# Patient Record
Sex: Female | Born: 1955 | Race: White | Hispanic: No | Marital: Married | Smoking: Never smoker
Health system: Southern US, Community
[De-identification: ages and names within clinical notes are randomized; demographics above are authoritative.]

## PROBLEM LIST (undated history)

## (undated) DIAGNOSIS — E079 Disorder of thyroid, unspecified: Secondary | ICD-10-CM

## (undated) DIAGNOSIS — I10 Essential (primary) hypertension: Secondary | ICD-10-CM

## (undated) HISTORY — DX: Essential (primary) hypertension: I10

## (undated) HISTORY — DX: Disorder of thyroid, unspecified: E07.9

---

## 1998-01-22 HISTORY — PX: CHOLECYSTECTOMY: SHX55

## 2012-06-24 ENCOUNTER — Other Ambulatory Visit: Payer: Self-pay

## 2012-06-24 MED ORDER — ROSUVASTATIN CALCIUM 10 MG PO TABS: 10.0000 mg | ORAL_TABLET | Freq: Every day | ORAL | Status: DC

## 2012-06-24 MED ORDER — HYDROCHLOROTHIAZIDE 25 MG PO TABS: 25.0000 mg | ORAL_TABLET | Freq: Every day | ORAL | Status: DC

## 2012-06-24 NOTE — Telephone Encounter (Signed)
Last seen 09/25/11  Last lipids 12/16.13

## 2012-11-18 ENCOUNTER — Other Ambulatory Visit (INDEPENDENT_AMBULATORY_CARE_PROVIDER_SITE_OTHER): Payer: 59

## 2012-11-18 ENCOUNTER — Encounter (INDEPENDENT_AMBULATORY_CARE_PROVIDER_SITE_OTHER): Payer: Self-pay

## 2012-11-18 DIAGNOSIS — E785 Hyperlipidemia, unspecified: Secondary | ICD-10-CM

## 2012-11-18 DIAGNOSIS — I10 Essential (primary) hypertension: Secondary | ICD-10-CM

## 2012-11-18 DIAGNOSIS — R7309 Other abnormal glucose: Secondary | ICD-10-CM

## 2012-11-18 DIAGNOSIS — E039 Hypothyroidism, unspecified: Secondary | ICD-10-CM

## 2012-11-18 DIAGNOSIS — R739 Hyperglycemia, unspecified: Secondary | ICD-10-CM

## 2012-11-20 LAB — CMP14+EGFR
ALT: 18 IU/L (ref 0–32)
AST: 19 IU/L (ref 0–40)
Albumin/Globulin Ratio: 1.5 (ref 1.1–2.5)
Albumin: 4.1 g/dL (ref 3.5–5.5)
Alkaline Phosphatase: 80 IU/L (ref 39–117)
BUN/Creatinine Ratio: 15 (ref 9–23)
BUN: 14 mg/dL (ref 6–24)
CO2: 22 mmol/L (ref 18–29)
Calcium: 9.1 mg/dL (ref 8.7–10.2)
Chloride: 100 mmol/L (ref 97–108)
Creatinine, Ser: 0.95 mg/dL (ref 0.57–1.00)
GFR calc Af Amer: 77 mL/min/{1.73_m2} (ref >59)
GFR calc non Af Amer: 67 mL/min/{1.73_m2} (ref >59)
Globulin, Total: 2.7 g/dL (ref 1.5–4.5)
Glucose: 111 mg/dL — ABNORMAL HIGH (ref 65–99)
Potassium: 4.4 mmol/L (ref 3.5–5.2)
Sodium: 140 mmol/L (ref 134–144)
Total Bilirubin: 0.2 mg/dL (ref 0.0–1.2)
Total Protein: 6.8 g/dL (ref 6.0–8.5)

## 2012-11-20 LAB — NMR, LIPOPROFILE
Cholesterol: 206 mg/dL — ABNORMAL HIGH (ref <200)
HDL Cholesterol by NMR: 46 mg/dL (ref >=40)
HDL Particle Number: 31.3 umol/L (ref >=30.5)
LDL Particle Number: 2539 nmol/L — ABNORMAL HIGH (ref <1000)
LDL Size: 19.8 nm — ABNORMAL LOW (ref >20.5)
LDLC SERPL CALC-MCNC: 122 mg/dL — ABNORMAL HIGH (ref <100)
LP-IR Score: 61 — ABNORMAL HIGH (ref <=45)
Small LDL Particle Number: 1923 nmol/L — ABNORMAL HIGH (ref <=527)
Triglycerides by NMR: 189 mg/dL — ABNORMAL HIGH (ref <150)

## 2012-11-20 LAB — THYROID PANEL WITH TSH: T3 Uptake Ratio: 29 % (ref 24–39)

## 2012-11-24 ENCOUNTER — Encounter: Payer: Self-pay | Admitting: Nurse Practitioner

## 2012-11-24 ENCOUNTER — Ambulatory Visit (INDEPENDENT_AMBULATORY_CARE_PROVIDER_SITE_OTHER): Payer: 59 | Admitting: Nurse Practitioner

## 2012-11-24 VITALS — BP 146/89 | HR 92 | Temp 98.6°F | Ht 65.0 in | Wt 175.0 lb

## 2012-11-24 DIAGNOSIS — I10 Essential (primary) hypertension: Secondary | ICD-10-CM | POA: Insufficient documentation

## 2012-11-24 DIAGNOSIS — E039 Hypothyroidism, unspecified: Secondary | ICD-10-CM | POA: Insufficient documentation

## 2012-11-24 DIAGNOSIS — E785 Hyperlipidemia, unspecified: Secondary | ICD-10-CM | POA: Insufficient documentation

## 2012-11-24 MED ORDER — LEVOTHYROXINE SODIUM 175 MCG PO TABS: 175.0000 ug | ORAL_TABLET | Freq: Every day | ORAL | Status: DC

## 2012-11-24 MED ORDER — HYDROCHLOROTHIAZIDE 25 MG PO TABS: 25.0000 mg | ORAL_TABLET | Freq: Every day | ORAL | Status: DC

## 2012-11-24 MED ORDER — ROSUVASTATIN CALCIUM 10 MG PO TABS: 10.0000 mg | ORAL_TABLET | Freq: Every day | ORAL | Status: DC

## 2012-11-24 NOTE — Progress Notes (Signed)
Subjective:    Patient ID: Teresa Ayala, female    DOB: 1955/05/30, 57 y.o.   MRN: [id number]  Hypertension This is a chronic problem. The current episode started more than 1 year ago. The problem has been resolved since onset. The problem is uncontrolled. Pertinent negatives include no blurred vision, chest pain, headaches, neck pain, orthopnea, palpitations, peripheral edema, shortness of breath or sweats. There are no associated agents to hypertension. Risk factors for coronary artery disease include dyslipidemia, family history and post-menopausal state. Treatments tried: patient stoped hger HCTZ. The current treatment provides moderate improvement. Compliance problems include diet and exercise.  Hypertensive end-organ damage includes a thyroid problem.  Hyperlipidemia This is a chronic problem. The current episode started more than 1 year ago. The problem is uncontrolled. Recent lipid tests were reviewed and are high. Exacerbating diseases include hypothyroidism. She has no history of diabetes, obesity or nephrotic syndrome. Pertinent negatives include no chest pain or shortness of breath. Treatments tried: currently not on anything. The current treatment provides no improvement of lipids. Compliance problems include adherence to diet and adherence to exercise.  Risk factors for coronary artery disease include hypertension and post-menopausal.  Thyroid Problem Presents for follow-up (hypothyroidism) visit. Patient reports no anxiety, cold intolerance, depressed mood, diarrhea, dry skin, hoarse voice, menstrual problem, palpitations, visual change or weight loss. Her past medical history is significant for hyperlipidemia. There is no history of diabetes.      Review of Systems  Constitutional: Negative for weight loss.  HENT: Negative for hoarse voice.   Eyes: Negative for blurred vision.  Respiratory: Negative for shortness of breath.   Cardiovascular: Negative for chest pain, palpitations and  orthopnea.  Gastrointestinal: Negative for diarrhea.  Endocrine: Negative for cold intolerance.  Genitourinary: Negative for menstrual problem.  Musculoskeletal: Negative for neck pain.  Neurological: Negative for headaches.       Objective:   Physical Exam  Constitutional: She is oriented to person, place, and time. She appears well-developed and well-nourished.  HENT:  Nose: Nose normal.  Mouth/Throat: Oropharynx is clear and moist.  Eyes: EOM are normal.  Neck: Trachea normal, normal range of motion and full passive range of motion without pain. Neck supple. No JVD present. Carotid bruit is not present. No thyromegaly present.  Cardiovascular: Normal rate, regular rhythm, normal heart sounds and intact distal pulses.  Exam reveals no gallop and no friction rub.   No murmur heard. Pulmonary/Chest: Effort normal and breath sounds normal.  Abdominal: Soft. Bowel sounds are normal. She exhibits no distension and no mass. There is no tenderness.  Musculoskeletal: Normal range of motion.  Lymphadenopathy:    She has no cervical adenopathy.  Neurological: She is alert and oriented to person, place, and time. She has normal reflexes.  Skin: Skin is warm and dry.  Psychiatric: She has a normal mood and affect. Her behavior is normal. Judgment and thought content normal.    BP 146/89  Pulse 92  Temp(Src) 98.6 F (37 C) (Oral)  Ht 5\' 5"  (1.651 m)  Wt 175 lb (79.379 kg)  BMI 29.12 kg/m2       Assessment & Plan:   1. Hypertension   2. Hyperlipidemia LDL goal < 100   3. Hypothyroidism    No orders of the defined types were placed in this encounter.   Meds ordered this encounter  Medications  . levothyroxine (SYNTHROID, LEVOTHROID) 175 MCG tablet    Sig: Take 175 mcg by mouth daily before breakfast.  . aspirin EC  81 MG tablet    Sig: Take 81 mg by mouth daily.  . hydrochlorothiazide (HYDRODIURIL) 25 MG tablet    Sig: Take 1 tablet (25 mg total) by mouth daily.    Dispense:   90 tablet    Refill:  1    Order Specific Question:  Supervising Provider    Answer:  Ernestina Penna [[id number]]  . rosuvastatin (CRESTOR) 10 MG tablet    Sig: Take 1 tablet (10 mg total) by mouth daily.    Dispense:  90 tablet    Refill:  1    Order Specific Question:  Supervising Provider    Answer:  Deborra Medina    Continue all meds Labs reviewed at appointment Diet and exercise encouraged Health maintenance reviewed Follow up in 3 months  Mary-Margaret Daphine Deutscher, FNP

## 2012-11-24 NOTE — Patient Instructions (Signed)

## 2013-01-20 ENCOUNTER — Other Ambulatory Visit: Payer: Self-pay | Admitting: Nurse Practitioner

## 2013-02-19 ENCOUNTER — Encounter: Payer: Self-pay | Admitting: Nurse Practitioner

## 2013-02-26 ENCOUNTER — Encounter: Payer: Self-pay | Admitting: Nurse Practitioner

## 2013-02-28 ENCOUNTER — Other Ambulatory Visit: Payer: Self-pay | Admitting: Nurse Practitioner

## 2013-02-28 MED ORDER — ROSUVASTATIN CALCIUM 10 MG PO TABS: 10.0000 mg | ORAL_TABLET | Freq: Every day | ORAL | Status: DC

## 2013-03-07 ENCOUNTER — Encounter: Payer: Self-pay | Admitting: Nurse Practitioner

## 2013-03-20 ENCOUNTER — Other Ambulatory Visit: Payer: Self-pay | Admitting: Nurse Practitioner

## 2013-04-22 ENCOUNTER — Other Ambulatory Visit: Payer: Self-pay | Admitting: *Deleted

## 2013-04-22 MED ORDER — LEVOTHYROXINE SODIUM 75 MCG PO TABS: ORAL_TABLET | ORAL | Status: DC

## 2013-04-28 ENCOUNTER — Other Ambulatory Visit: Payer: Self-pay | Admitting: *Deleted

## 2013-04-28 MED ORDER — HYDROCHLOROTHIAZIDE 25 MG PO TABS: 25.0000 mg | ORAL_TABLET | Freq: Every day | ORAL | Status: DC

## 2013-04-28 NOTE — Telephone Encounter (Signed)
Patient NTBS for follow up and lab work  

## 2013-05-04 ENCOUNTER — Other Ambulatory Visit: Payer: Self-pay | Admitting: *Deleted

## 2013-05-04 MED ORDER — ROSUVASTATIN CALCIUM 10 MG PO TABS: 10.0000 mg | ORAL_TABLET | Freq: Every day | ORAL | Status: DC

## 2013-05-27 ENCOUNTER — Other Ambulatory Visit: Payer: Self-pay | Admitting: *Deleted

## 2013-05-27 MED ORDER — HYDROCHLOROTHIAZIDE 25 MG PO TABS: 25.0000 mg | ORAL_TABLET | Freq: Every day | ORAL | Status: DC

## 2013-05-27 NOTE — Telephone Encounter (Signed)
No ore refills after this without being seen

## 2013-05-27 NOTE — Telephone Encounter (Signed)
Patient notified at last refill that NTBS. Please advise on refill 

## 2013-06-04 ENCOUNTER — Other Ambulatory Visit: Payer: Self-pay

## 2013-06-04 NOTE — Telephone Encounter (Signed)
Last seen 11/24/12  MMM Last lipid 11/18/12

## 2013-06-04 NOTE — Telephone Encounter (Signed)
ntbs for crestor refill

## 2013-07-22 ENCOUNTER — Other Ambulatory Visit: Payer: Self-pay | Admitting: Nurse Practitioner

## 2013-09-19 ENCOUNTER — Other Ambulatory Visit: Payer: Self-pay | Admitting: Nurse Practitioner

## 2013-09-21 NOTE — Telephone Encounter (Signed)
Last ov 11/14. ntbs 

## 2013-09-22 NOTE — Telephone Encounter (Signed)
no more refills without being seen  

## 2013-10-01 ENCOUNTER — Other Ambulatory Visit: Payer: Self-pay | Admitting: Nurse Practitioner

## 2013-10-08 ENCOUNTER — Telehealth: Payer: Self-pay | Admitting: Nurse Practitioner

## 2013-10-08 ENCOUNTER — Encounter: Payer: Self-pay | Admitting: Nurse Practitioner

## 2013-10-08 MED ORDER — ROSUVASTATIN CALCIUM 10 MG PO TABS: 10.0000 mg | ORAL_TABLET | Freq: Every day | ORAL | Status: DC

## 2013-10-08 NOTE — Telephone Encounter (Signed)
Left message that crestor has been refilled and to call and schedule appt with Gennette Pac for the first available spot the switchboard is able to schedule.  I am unable to work her into a triage a slot for a refill.

## 2013-10-17 ENCOUNTER — Other Ambulatory Visit: Payer: Self-pay | Admitting: Nurse Practitioner

## 2013-12-14 ENCOUNTER — Ambulatory Visit (INDEPENDENT_AMBULATORY_CARE_PROVIDER_SITE_OTHER): Payer: 59 | Admitting: Nurse Practitioner

## 2013-12-14 ENCOUNTER — Encounter: Payer: Self-pay | Admitting: Nurse Practitioner

## 2013-12-14 VITALS — BP 132/82 | HR 85 | Temp 96.4°F | Ht 65.0 in | Wt 172.0 lb

## 2013-12-14 DIAGNOSIS — I10 Essential (primary) hypertension: Secondary | ICD-10-CM

## 2013-12-14 DIAGNOSIS — E785 Hyperlipidemia, unspecified: Secondary | ICD-10-CM

## 2013-12-14 DIAGNOSIS — Z0000 Encounter for general adult medical examination without abnormal findings: Secondary | ICD-10-CM

## 2013-12-14 DIAGNOSIS — E039 Hypothyroidism, unspecified: Secondary | ICD-10-CM

## 2013-12-14 LAB — POCT UA - MICROSCOPIC ONLY
BACTERIA, U MICROSCOPIC: NEGATIVE
CRYSTALS, UR, HPF, POC: NEGATIVE
Casts, Ur, LPF, POC: NEGATIVE
Mucus, UA: NEGATIVE
Yeast, UA: NEGATIVE

## 2013-12-14 LAB — POCT URINALYSIS DIPSTICK
BILIRUBIN UA: NEGATIVE
Glucose, UA: NEGATIVE
KETONES UA: NEGATIVE
Leukocytes, UA: NEGATIVE
Nitrite, UA: NEGATIVE
Protein, UA: NEGATIVE
SPEC GRAV UA: 1.015
Urobilinogen, UA: NEGATIVE
pH, UA: 6

## 2013-12-14 MED ORDER — ROSUVASTATIN CALCIUM 10 MG PO TABS: 10.0000 mg | ORAL_TABLET | Freq: Every day | ORAL | Status: DC

## 2013-12-14 MED ORDER — HYDROCHLOROTHIAZIDE 25 MG PO TABS: 25.0000 mg | ORAL_TABLET | Freq: Every day | ORAL | Status: DC

## 2013-12-14 MED ORDER — LEVOTHYROXINE SODIUM 75 MCG PO TABS: ORAL_TABLET | ORAL | Status: DC

## 2013-12-14 NOTE — Progress Notes (Signed)
Subjective:    Patient ID: Teresa Ayala, female    DOB: 04/26/1955, 58 y.o.   MRN: [id number] Patient is here for annual physical with no PAP. No acute complaint.   Hypertension This is a chronic problem. The current episode started more than 1 year ago. The problem is controlled. Pertinent negatives include no chest pain, headaches, neck pain, palpitations or shortness of breath. Risk factors for coronary artery disease include dyslipidemia. Past treatments include diuretics. The current treatment provides moderate improvement. Compliance problems include diet.  Hypertensive end-organ damage includes a thyroid problem.  Hyperlipidemia This is a chronic problem. The current episode started more than 1 year ago. The problem is controlled. Recent lipid tests were reviewed and are normal. Exacerbating diseases include hypothyroidism. She has no history of diabetes or obesity. Pertinent negatives include no chest pain or shortness of breath. Current antihyperlipidemic treatment includes statins. Risk factors for coronary artery disease include hypertension and dyslipidemia.  Thyroid Problem Presents for follow-up (hypothyroidism) visit. Patient reports no cold intolerance, diarrhea, menstrual problem, palpitations or visual change. The symptoms have been improving. Her past medical history is significant for hyperlipidemia. There is no history of diabetes.      Review of Systems  Respiratory: Negative for shortness of breath.   Cardiovascular: Negative for chest pain and palpitations.  Gastrointestinal: Negative for diarrhea.  Endocrine: Negative for cold intolerance.  Genitourinary: Negative for menstrual problem.  Musculoskeletal: Negative for neck pain.  Neurological: Negative for headaches.  All other systems reviewed and are negative.      Objective:   Physical Exam  Constitutional: She is oriented to person, place, and time. She appears well-developed and well-nourished.  HENT:  Nose:  Nose normal.  Mouth/Throat: Oropharynx is clear and moist.  Eyes: EOM are normal.  Neck: Trachea normal, normal range of motion and full passive range of motion without pain. Neck supple. No JVD present. Carotid bruit is not present. No thyromegaly present.  Cardiovascular: Normal rate, regular rhythm, normal heart sounds and intact distal pulses.  Exam reveals no gallop and no friction rub.   No murmur heard. Pulmonary/Chest: Effort normal and breath sounds normal.  Abdominal: Soft. Bowel sounds are normal. She exhibits no distension and no mass. There is no tenderness.  Musculoskeletal: Normal range of motion.  Lymphadenopathy:    She has no cervical adenopathy.  Neurological: She is alert and oriented to person, place, and time. She has normal reflexes.  Skin: Skin is warm and dry.  Psychiatric: She has a normal mood and affect. Her behavior is normal. Judgment and thought content normal.    BP 132/82 mmHg  Pulse 85  Temp(Src) 96.4 F (35.8 C) (Oral)  Ht 5\' 5"  (1.651 m)  Wt 172 lb (78.019 kg)  BMI 28.62 kg/m2       Assessment & Plan:   1. Annual physical exam - POCT UA - Microscopic Only - POCT urinalysis dipstick - CBC With differential/Platelet  2. Hypothyroidism, unspecified hypothyroidism type - Thyroid Panel With TSH - levothyroxine (SYNTHROID, LEVOTHROID) 75 MCG tablet; TAKE 1 TABLET BY MOUTH EVERY DAY BEFORE BREAKFAST  Dispense: 90 tablet; Refill: 1  3. Hyperlipidemia with target LDL less than 100 Low fat diet - NMR, lipoprofile - rosuvastatin (CRESTOR) 10 MG tablet; Take 1 tablet (10 mg total) by mouth daily.  Dispense: 90 tablet; Refill: 1  4. Essential hypertension Do not add salt to diet - CMP14+EGFR - hydrochlorothiazide (HYDRODIURIL) 25 MG tablet; Take 1 tablet (25 mg total) by mouth daily.  Dispense: 90 tablet; Refill: 1    Labs pending Health maintenance reviewed Diet and exercise encouraged Continue all meds Follow up  In 6 months    [place], FNP

## 2013-12-14 NOTE — Patient Instructions (Signed)

## 2013-12-15 LAB — CBC WITH DIFFERENTIAL
Basophils Absolute: 0 10*3/uL (ref 0.0–0.2)
Basos: 1 %
EOS: 1 %
Eosinophils Absolute: 0.1 10*3/uL (ref 0.0–0.4)
HCT: 38.5 % (ref 34.0–46.6)
HEMOGLOBIN: 13.2 g/dL (ref 11.1–15.9)
IMMATURE GRANS (ABS): 0 10*3/uL (ref 0.0–0.1)
IMMATURE GRANULOCYTES: 0 %
Lymphocytes Absolute: 2.2 10*3/uL (ref 0.7–3.1)
Lymphs: 40 %
MCH: 32.4 pg (ref 26.6–33.0)
MCHC: 34.3 g/dL (ref 31.5–35.7)
MCV: 94 fL (ref 79–97)
MONOCYTES: 7 %
Monocytes Absolute: 0.4 10*3/uL (ref 0.1–0.9)
NEUTROS PCT: 51 %
Neutrophils Absolute: 2.8 10*3/uL (ref 1.4–7.0)
Platelets: 253 10*3/uL (ref 150–379)
RBC: 4.08 x10E6/uL (ref 3.77–5.28)
RDW: 13.6 % (ref 12.3–15.4)
WBC: 5.4 10*3/uL (ref 3.4–10.8)

## 2013-12-15 LAB — CMP14+EGFR
A/G RATIO: 1.5 (ref 1.1–2.5)
ALK PHOS: 58 IU/L (ref 39–117)
ALT: 19 IU/L (ref 0–32)
AST: 21 IU/L (ref 0–40)
Albumin: 4.1 g/dL (ref 3.5–5.5)
BUN / CREAT RATIO: 13 (ref 9–23)
BUN: 12 mg/dL (ref 6–24)
CO2: 26 mmol/L (ref 18–29)
CREATININE: 0.92 mg/dL (ref 0.57–1.00)
Calcium: 9.2 mg/dL (ref 8.7–10.2)
Chloride: 101 mmol/L (ref 97–108)
GFR calc Af Amer: 80 mL/min/{1.73_m2} (ref >59)
GFR, EST NON AFRICAN AMERICAN: 69 mL/min/{1.73_m2} (ref >59)
GLOBULIN, TOTAL: 2.8 g/dL (ref 1.5–4.5)
Glucose: 95 mg/dL (ref 65–99)
Potassium: 4 mmol/L (ref 3.5–5.2)
SODIUM: 142 mmol/L (ref 134–144)
Total Bilirubin: 0.4 mg/dL (ref 0.0–1.2)
Total Protein: 6.9 g/dL (ref 6.0–8.5)

## 2013-12-15 LAB — THYROID PANEL WITH TSH
Free Thyroxine Index: 2.8 (ref 1.2–4.9)
T3 UPTAKE RATIO: 29 % (ref 24–39)
T4, Total: 9.5 ug/dL (ref 4.5–12.0)
TSH: 3.01 u[IU]/mL (ref 0.450–4.500)

## 2013-12-15 LAB — NMR, LIPOPROFILE
CHOLESTEROL: 111 mg/dL (ref 100–199)
HDL CHOLESTEROL BY NMR: 45 mg/dL (ref >39)
HDL Particle Number: 35.2 umol/L (ref >=30.5)
LDL Particle Number: 897 nmol/L (ref <1000)
LDL Size: 19.9 nm (ref >20.5)
LDL-C: 49 mg/dL (ref 0–99)
LP-IR Score: 64 — ABNORMAL HIGH (ref <=45)
Small LDL Particle Number: 626 nmol/L — ABNORMAL HIGH (ref <=527)
Triglycerides by NMR: 86 mg/dL (ref 0–149)

## 2013-12-29 ENCOUNTER — Encounter: Payer: Self-pay | Admitting: Nurse Practitioner

## 2014-01-03 ENCOUNTER — Other Ambulatory Visit: Payer: Self-pay | Admitting: Nurse Practitioner

## 2014-01-25 ENCOUNTER — Other Ambulatory Visit: Payer: Self-pay | Admitting: Nurse Practitioner

## 2014-06-08 ENCOUNTER — Other Ambulatory Visit: Payer: Self-pay | Admitting: Nurse Practitioner

## 2014-06-14 ENCOUNTER — Ambulatory Visit (INDEPENDENT_AMBULATORY_CARE_PROVIDER_SITE_OTHER): Payer: 59 | Admitting: Nurse Practitioner

## 2014-06-14 ENCOUNTER — Encounter: Payer: Self-pay | Admitting: Nurse Practitioner

## 2014-06-14 DIAGNOSIS — E039 Hypothyroidism, unspecified: Secondary | ICD-10-CM

## 2014-06-14 DIAGNOSIS — I10 Essential (primary) hypertension: Secondary | ICD-10-CM

## 2014-06-14 DIAGNOSIS — E785 Hyperlipidemia, unspecified: Secondary | ICD-10-CM

## 2014-06-14 MED ORDER — HYDROCHLOROTHIAZIDE 25 MG PO TABS: ORAL_TABLET | ORAL | Status: DC

## 2014-06-14 MED ORDER — ROSUVASTATIN CALCIUM 10 MG PO TABS
10.0000 mg | ORAL_TABLET | Freq: Every day | ORAL | Status: DC
Start: 2014-06-14 — End: 2014-09-30

## 2014-06-14 MED ORDER — LEVOTHYROXINE SODIUM 75 MCG PO TABS: ORAL_TABLET | ORAL | Status: DC

## 2014-06-14 NOTE — Patient Instructions (Signed)
Fat and Cholesterol Control Diet Fat and cholesterol levels in your blood and organs are influenced by your diet. High levels of fat and cholesterol may lead to diseases of the heart, small and large blood vessels, gallbladder, liver, and pancreas. CONTROLLING FAT AND CHOLESTEROL WITH DIET Although exercise and lifestyle factors are important, your diet is key. That is because certain foods are known to raise cholesterol and others to lower it. The goal is to balance foods for their effect on cholesterol and more importantly, to replace saturated and trans fat with other types of fat, such as monounsaturated fat, polyunsaturated fat, and omega-3 fatty acids. On average, a person should consume no more than 15 to 17 g of saturated fat daily. Saturated and trans fats are considered "bad" fats, and they will raise LDL cholesterol. Saturated fats are primarily found in animal products such as meats, butter, and cream. However, that does not mean you need to give up all your favorite foods. Today, there are good tasting, low-fat, low-cholesterol substitutes for most of the things you like to eat. Choose low-fat or nonfat alternatives. Choose round or loin cuts of red meat. These types of cuts are lowest in fat and cholesterol. Chicken (without the skin), fish, veal, and ground turkey breast are great choices. Eliminate fatty meats, such as hot dogs and salami. Even shellfish have little or no saturated fat. Have a 3 oz (85 g) portion when you eat lean meat, poultry, or fish. Trans fats are also called "partially hydrogenated oils." They are oils that have been scientifically manipulated so that they are solid at room temperature resulting in a longer shelf life and improved taste and texture of foods in which they are added. Trans fats are found in stick margarine, some tub margarines, cookies, crackers, and baked goods.  When baking and cooking, oils are a great substitute for butter. The monounsaturated oils are  especially beneficial since it is believed they lower LDL and raise HDL. The oils you should avoid entirely are saturated tropical oils, such as coconut and palm.  Remember to eat a lot from food groups that are naturally free of saturated and trans fat, including fish, fruit, vegetables, beans, grains (barley, rice, couscous, bulgur wheat), and pasta (without cream sauces).  IDENTIFYING FOODS THAT LOWER FAT AND CHOLESTEROL  Soluble fiber may lower your cholesterol. This type of fiber is found in fruits such as apples, vegetables such as broccoli, potatoes, and carrots, legumes such as beans, peas, and lentils, and grains such as barley. Foods fortified with plant sterols (phytosterol) may also lower cholesterol. You should eat at least 2 g per day of these foods for a cholesterol lowering effect.  Read package labels to identify low-saturated fats, trans fat free, and low-fat foods at the supermarket. Select cheeses that have only 2 to 3 g saturated fat per ounce. Use a heart-healthy tub margarine that is free of trans fats or partially hydrogenated oil. When buying baked goods (cookies, crackers), avoid partially hydrogenated oils. Breads and muffins should be made from whole grains (whole-wheat or whole oat flour, instead of "flour" or "enriched flour"). Buy non-creamy canned soups with reduced salt and no added fats.  FOOD PREPARATION TECHNIQUES  Never deep-fry. If you must fry, either stir-fry, which uses very little fat, or use non-stick cooking sprays. When possible, broil, bake, or roast meats, and steam vegetables. Instead of putting butter or margarine on vegetables, use lemon and herbs, applesauce, and cinnamon (for squash and sweet potatoes). Use nonfat   yogurt, salsa, and low-fat dressings for salads.  LOW-SATURATED FAT / LOW-FAT FOOD SUBSTITUTES Meats / Saturated Fat (g)  Avoid: Steak, marbled (3 oz/85 g) / 11 g  Choose: Steak, lean (3 oz/85 g) / 4 g  Avoid: Hamburger (3 oz/85 g) / 7  g  Choose: Hamburger, lean (3 oz/85 g) / 5 g  Avoid: Ham (3 oz/85 g) / 6 g  Choose: Ham, lean cut (3 oz/85 g) / 2.4 g  Avoid: Chicken, with skin, dark meat (3 oz/85 g) / 4 g  Choose: Chicken, skin removed, dark meat (3 oz/85 g) / 2 g  Avoid: Chicken, with skin, light meat (3 oz/85 g) / 2.5 g  Choose: Chicken, skin removed, light meat (3 oz/85 g) / 1 g Dairy / Saturated Fat (g)  Avoid: Whole milk (1 cup) / 5 g  Choose: Low-fat milk, 2% (1 cup) / 3 g  Choose: Low-fat milk, 1% (1 cup) / 1.5 g  Choose: Skim milk (1 cup) / 0.3 g  Avoid: Hard cheese (1 oz/28 g) / 6 g  Choose: Skim milk cheese (1 oz/28 g) / 2 to 3 g  Avoid: Cottage cheese, 4% fat (1 cup) / 6.5 g  Choose: Low-fat cottage cheese, 1% fat (1 cup) / 1.5 g  Avoid: Ice cream (1 cup) / 9 g  Choose: Sherbet (1 cup) / 2.5 g  Choose: Nonfat frozen yogurt (1 cup) / 0.3 g  Choose: Frozen fruit bar / trace  Avoid: Whipped cream (1 tbs) / 3.5 g  Choose: Nondairy whipped topping (1 tbs) / 1 g Condiments / Saturated Fat (g)  Avoid: Mayonnaise (1 tbs) / 2 g  Choose: Low-fat mayonnaise (1 tbs) / 1 g  Avoid: Butter (1 tbs) / 7 g  Choose: Extra light margarine (1 tbs) / 1 g  Avoid: Coconut oil (1 tbs) / 11.8 g  Choose: Olive oil (1 tbs) / 1.8 g  Choose: Corn oil (1 tbs) / 1.7 g  Choose: Safflower oil (1 tbs) / 1.2 g  Choose: Sunflower oil (1 tbs) / 1.4 g  Choose: Soybean oil (1 tbs) / 2.4 g  Choose: Canola oil (1 tbs) / 1 g Document Released: 01/08/2005 Document Revised: 05/05/2012 Document Reviewed: 04/08/2013 [employer] Patient Information 2015 [employer]. This information is not intended to replace advice given to you by your health care provider. Make sure you discuss any questions you have with your health care provider.  

## 2014-06-14 NOTE — Progress Notes (Signed)
Subjective:    Patient ID: Teresa Ayala, female    DOB: Jan 14, 1956, 58 y.o.   MRN: [id number]  Patient here today for follow up of chronic medical problems. No complaints today.   Hypertension This is a chronic problem. The current episode started more than 1 year ago. The problem is controlled. Pertinent negatives include no chest pain, headaches, neck pain, palpitations or shortness of breath. Risk factors for coronary artery disease include dyslipidemia. Past treatments include diuretics. The current treatment provides moderate improvement. Compliance problems include diet.  Hypertensive end-organ damage includes a thyroid problem.  Hyperlipidemia This is a chronic problem. The current episode started more than 1 year ago. The problem is controlled. Recent lipid tests were reviewed and are normal. Exacerbating diseases include hypothyroidism. She has no history of diabetes or obesity. Pertinent negatives include no chest pain or shortness of breath. Current antihyperlipidemic treatment includes statins. Risk factors for coronary artery disease include hypertension and dyslipidemia.  Thyroid Problem Presents for follow-up (hypothyroidism) visit. Patient reports no cold intolerance, diarrhea, menstrual problem, palpitations or visual change. The symptoms have been improving. Her past medical history is significant for hyperlipidemia. There is no history of diabetes.      Review of Systems  Respiratory: Negative for shortness of breath.   Cardiovascular: Negative for chest pain and palpitations.  Gastrointestinal: Negative for diarrhea.  Endocrine: Negative for cold intolerance.  Genitourinary: Negative for menstrual problem.  Musculoskeletal: Negative for neck pain.  Neurological: Negative for headaches.  All other systems reviewed and are negative.      Objective:   Physical Exam  Constitutional: She is oriented to person, place, and time. She appears well-developed and well-nourished.   HENT:  Nose: Nose normal.  Mouth/Throat: Oropharynx is clear and moist.  Eyes: EOM are normal.  Neck: Trachea normal, normal range of motion and full passive range of motion without pain. Neck supple. No JVD present. Carotid bruit is not present. No thyromegaly present.  Cardiovascular: Normal rate, regular rhythm, normal heart sounds and intact distal pulses.  Exam reveals no gallop and no friction rub.   No murmur heard. Pulmonary/Chest: Effort normal and breath sounds normal.  Abdominal: Soft. Bowel sounds are normal. She exhibits no distension and no mass. There is no tenderness.  Musculoskeletal: Normal range of motion.  Lymphadenopathy:    She has no cervical adenopathy.  Neurological: She is alert and oriented to person, place, and time. She has normal reflexes.  Skin: Skin is warm and dry.  Psychiatric: She has a normal mood and affect. Her behavior is normal. Judgment and thought content normal.    BP 132/82 mmHg  Pulse 85  Temp(Src) 98.7 F (37.1 C) (Oral)  Ht 5' 5"  (1.651 m)  Wt 173 lb 12.8 oz (78.835 kg)  BMI 28.92 kg/m2        Assessment & Plan:    1. Essential hypertension Do not add slat to diet - hydrochlorothiazide (HYDRODIURIL) 25 MG tablet; TAKE 1 TABLET (25 MG TOTAL) BY MOUTH DAILY.  Dispense: 90 tablet; Refill: 0 - CMP14+EGFR  2. Hypothyroidism, unspecified hypothyroidism type - levothyroxine (SYNTHROID, LEVOTHROID) 75 MCG tablet; TAKE 1 TABLET BY MOUTH EVERY DAY BEFORE BREAKFAST  Dispense: 90 tablet; Refill: 1  3. Hyperlipidemia with target LDL less than 100 Low fta diet - rosuvastatin (CRESTOR) 10 MG tablet; Take 1 tablet (10 mg total) by mouth daily.  Dispense: 90 tablet; Refill: 1 - NMR, lipoprofile    Labs pending Health maintenance reviewed Diet and exercise encouraged  Continue all meds Follow up  In 3 month   [place], FNP

## 2014-06-15 LAB — CMP14+EGFR
A/G RATIO: 1.5 (ref 1.1–2.5)
ALK PHOS: 64 IU/L (ref 39–117)
ALT: 18 IU/L (ref 0–32)
AST: 24 IU/L (ref 0–40)
Albumin: 4.2 g/dL (ref 3.5–5.5)
BILIRUBIN TOTAL: 0.3 mg/dL (ref 0.0–1.2)
BUN/Creatinine Ratio: 15 (ref 9–23)
BUN: 14 mg/dL (ref 6–24)
CHLORIDE: 99 mmol/L (ref 97–108)
CO2: 24 mmol/L (ref 18–29)
CREATININE: 0.96 mg/dL (ref 0.57–1.00)
Calcium: 9 mg/dL (ref 8.7–10.2)
GFR calc Af Amer: 76 mL/min/{1.73_m2} (ref >59)
GFR, EST NON AFRICAN AMERICAN: 66 mL/min/{1.73_m2} (ref >59)
Globulin, Total: 2.8 g/dL (ref 1.5–4.5)
Glucose: 110 mg/dL — ABNORMAL HIGH (ref 65–99)
Potassium: 3.8 mmol/L (ref 3.5–5.2)
SODIUM: 140 mmol/L (ref 134–144)
TOTAL PROTEIN: 7 g/dL (ref 6.0–8.5)

## 2014-06-15 LAB — NMR, LIPOPROFILE
Cholesterol: 128 mg/dL (ref 100–199)
HDL Cholesterol by NMR: 47 mg/dL (ref >39)
HDL PARTICLE NUMBER: 38.1 umol/L (ref >=30.5)
LDL Particle Number: 834 nmol/L (ref <1000)
LDL SIZE: 19.9 nm (ref >20.5)
LDL-C: 58 mg/dL (ref 0–99)
LP-IR SCORE: 55 — AB (ref <=45)
Small LDL Particle Number: 550 nmol/L — ABNORMAL HIGH (ref <=527)
Triglycerides by NMR: 115 mg/dL (ref 0–149)

## 2014-07-02 ENCOUNTER — Other Ambulatory Visit: Payer: Self-pay | Admitting: Nurse Practitioner

## 2014-07-23 ENCOUNTER — Other Ambulatory Visit: Payer: Self-pay | Admitting: Nurse Practitioner

## 2014-09-30 ENCOUNTER — Ambulatory Visit (INDEPENDENT_AMBULATORY_CARE_PROVIDER_SITE_OTHER): Payer: 59 | Admitting: Nurse Practitioner

## 2014-09-30 ENCOUNTER — Encounter: Payer: Self-pay | Admitting: Nurse Practitioner

## 2014-09-30 VITALS — BP 137/79 | HR 87 | Temp 97.8°F | Ht 65.0 in | Wt 173.0 lb

## 2014-09-30 DIAGNOSIS — E785 Hyperlipidemia, unspecified: Secondary | ICD-10-CM

## 2014-09-30 DIAGNOSIS — Z6829 Body mass index (BMI) 29.0-29.9, adult: Secondary | ICD-10-CM | POA: Insufficient documentation

## 2014-09-30 DIAGNOSIS — Z6828 Body mass index (BMI) 28.0-28.9, adult: Secondary | ICD-10-CM

## 2014-09-30 DIAGNOSIS — I10 Essential (primary) hypertension: Secondary | ICD-10-CM

## 2014-09-30 DIAGNOSIS — E039 Hypothyroidism, unspecified: Secondary | ICD-10-CM

## 2014-09-30 MED ORDER — HYDROCHLOROTHIAZIDE 25 MG PO TABS: ORAL_TABLET | ORAL | Status: DC

## 2014-09-30 MED ORDER — ROSUVASTATIN CALCIUM 10 MG PO TABS: 10.0000 mg | ORAL_TABLET | Freq: Every day | ORAL | Status: DC

## 2014-09-30 MED ORDER — LEVOTHYROXINE SODIUM 75 MCG PO TABS: ORAL_TABLET | ORAL | Status: DC

## 2014-09-30 NOTE — Progress Notes (Signed)
Subjective:    Patient ID: Teresa Ayala, female    DOB: 02-Sep-1955, 59 y.o.   MRN: [id number]  Patient here today for follow up of chronic medical problems. No complaints today.   Hypertension This is a chronic problem. The current episode started more than 1 year ago. The problem is controlled. Pertinent negatives include no chest pain, headaches, neck pain, palpitations or shortness of breath. Risk factors for coronary artery disease include dyslipidemia. Past treatments include diuretics. The current treatment provides moderate improvement. Compliance problems include diet.  Hypertensive end-organ damage includes a thyroid problem.  Hyperlipidemia This is a chronic problem. The current episode started more than 1 year ago. The problem is controlled. Recent lipid tests were reviewed and are normal. Exacerbating diseases include hypothyroidism. She has no history of diabetes or obesity. Pertinent negatives include no chest pain or shortness of breath. Current antihyperlipidemic treatment includes statins. Risk factors for coronary artery disease include hypertension and dyslipidemia.  Thyroid Problem Presents for follow-up (hypothyroidism) visit. Patient reports no cold intolerance, diarrhea, menstrual problem, palpitations or visual change. The symptoms have been improving. Her past medical history is significant for hyperlipidemia. There is no history of diabetes.      Review of Systems  Respiratory: Negative for shortness of breath.   Cardiovascular: Negative for chest pain and palpitations.  Gastrointestinal: Negative for diarrhea.  Endocrine: Negative for cold intolerance.  Genitourinary: Negative for menstrual problem.  Musculoskeletal: Negative for neck pain.  Neurological: Negative for headaches.  All other systems reviewed and are negative.      Objective:   Physical Exam  Constitutional: She is oriented to person, place, and time. She appears well-developed and well-nourished.   HENT:  Nose: Nose normal.  Mouth/Throat: Oropharynx is clear and moist.  Eyes: EOM are normal.  Neck: Trachea normal, normal range of motion and full passive range of motion without pain. Neck supple. No JVD present. Carotid bruit is not present. No thyromegaly present.  Cardiovascular: Normal rate, regular rhythm, normal heart sounds and intact distal pulses.  Exam reveals no gallop and no friction rub.   No murmur heard. Pulmonary/Chest: Effort normal and breath sounds normal.  Abdominal: Soft. Bowel sounds are normal. She exhibits no distension and no mass. There is no tenderness.  Musculoskeletal: Normal range of motion.  Lymphadenopathy:    She has no cervical adenopathy.  Neurological: She is alert and oriented to person, place, and time. She has normal reflexes.  Skin: Skin is warm and dry.  Psychiatric: She has a normal mood and affect. Her behavior is normal. Judgment and thought content normal.    BP 137/79 mmHg  Pulse 87  Temp(Src) 97.8 F (36.6 C) (Oral)  Ht 5' 5"  (1.651 m)  Wt 173 lb (78.472 kg)  BMI 28.79 kg/m2        Assessment & Plan:   1. Essential hypertension Do not add salt to diet - CMP14+EGFR - hydrochlorothiazide (HYDRODIURIL) 25 MG tablet; TAKE 1 TABLET (25 MG TOTAL) BY MOUTH DAILY.  Dispense: 90 tablet; Refill: 1  2. Hypothyroidism, unspecified hypothyroidism type - Thyroid Panel With TSH - levothyroxine (SYNTHROID, LEVOTHROID) 75 MCG tablet; TAKE 1 TABLET BY MOUTH EVERY DAY BEFORE BREAKFAST  Dispense: 90 tablet; Refill: 1  3. Hyperlipidemia with target LDL less than 100 Low fta diet - Lipid panel - rosuvastatin (CRESTOR) 10 MG tablet; Take 1 tablet (10 mg total) by mouth daily.  Dispense: 90 tablet; Refill: 1  4. BMI 28.0-28.9,adult Discussed diet and exercise for person  with BMI >25 Will recheck weight in 3-6 months     Labs pending Health maintenance reviewed Diet and exercise encouraged Continue all meds Follow up  In 6 month    [place], FNP

## 2014-09-30 NOTE — Patient Instructions (Signed)
Exercise to Stay Healthy Exercise helps you become and stay healthy. EXERCISE IDEAS AND TIPS Choose exercises that:  You enjoy.  Fit into your day. You do not need to exercise really hard to be healthy. You can do exercises at a slow or medium level and stay healthy. You can:  Stretch before and after working out.  Try yoga, Pilates, or tai chi.  Lift weights.  Walk fast, swim, jog, run, climb stairs, bicycle, dance, or rollerskate.  Take aerobic classes. Exercises that burn about 150 calories:  Running 1  miles in 15 minutes.  Playing volleyball for 45 to 60 minutes.  Washing and waxing a car for 45 to 60 minutes.  Playing touch football for 45 minutes.  Walking 1  miles in 35 minutes.  Pushing a stroller 1  miles in 30 minutes.  Playing basketball for 30 minutes.  Raking leaves for 30 minutes.  Bicycling 5 miles in 30 minutes.  Walking 2 miles in 30 minutes.  Dancing for 30 minutes.  Shoveling snow for 15 minutes.  Swimming laps for 20 minutes.  Walking up stairs for 15 minutes.  Bicycling 4 miles in 15 minutes.  Gardening for 30 to 45 minutes.  Jumping rope for 15 minutes.  Washing windows or floors for 45 to 60 minutes. Document Released: 02/10/2010 Document Revised: 04/02/2011 Document Reviewed: 02/10/2010 [facility] Patient Information 2015 [place], [place]. This information is not intended to replace advice given to you by your health care provider. Make sure you discuss any questions you have with your health care provider.

## 2014-10-01 LAB — CMP14+EGFR
A/G RATIO: 1.6 (ref 1.1–2.5)
ALK PHOS: 62 IU/L (ref 39–117)
ALT: 23 IU/L (ref 0–32)
AST: 26 IU/L (ref 0–40)
Albumin: 4.2 g/dL (ref 3.5–5.5)
BILIRUBIN TOTAL: 0.4 mg/dL (ref 0.0–1.2)
BUN/Creatinine Ratio: 16 (ref 9–23)
BUN: 16 mg/dL (ref 6–24)
CO2: 26 mmol/L (ref 18–29)
CREATININE: 0.97 mg/dL (ref 0.57–1.00)
Calcium: 9.4 mg/dL (ref 8.7–10.2)
Chloride: 98 mmol/L (ref 97–108)
GFR calc Af Amer: 74 mL/min/{1.73_m2} (ref >59)
GFR calc non Af Amer: 65 mL/min/{1.73_m2} (ref >59)
GLOBULIN, TOTAL: 2.7 g/dL (ref 1.5–4.5)
Glucose: 110 mg/dL — ABNORMAL HIGH (ref 65–99)
POTASSIUM: 4.2 mmol/L (ref 3.5–5.2)
SODIUM: 140 mmol/L (ref 134–144)
Total Protein: 6.9 g/dL (ref 6.0–8.5)

## 2014-10-01 LAB — LIPID PANEL
CHOLESTEROL TOTAL: 122 mg/dL (ref 100–199)
Chol/HDL Ratio: 2.8 ratio units (ref 0.0–4.4)
HDL: 44 mg/dL (ref >39)
LDL CALC: 49 mg/dL (ref 0–99)
Triglycerides: 147 mg/dL (ref 0–149)
VLDL CHOLESTEROL CAL: 29 mg/dL (ref 5–40)

## 2014-10-01 LAB — THYROID PANEL WITH TSH
Free Thyroxine Index: 2.7 (ref 1.2–4.9)
T3 Uptake Ratio: 29 % (ref 24–39)
T4 TOTAL: 9.2 ug/dL (ref 4.5–12.0)
TSH: 3.54 u[IU]/mL (ref 0.450–4.500)

## 2015-03-28 ENCOUNTER — Other Ambulatory Visit: Payer: Self-pay | Admitting: Nurse Practitioner

## 2015-03-31 ENCOUNTER — Ambulatory Visit (INDEPENDENT_AMBULATORY_CARE_PROVIDER_SITE_OTHER): Payer: 59 | Admitting: Nurse Practitioner

## 2015-03-31 ENCOUNTER — Encounter: Payer: Self-pay | Admitting: Internal Medicine

## 2015-03-31 ENCOUNTER — Encounter: Payer: Self-pay | Admitting: Nurse Practitioner

## 2015-03-31 ENCOUNTER — Ambulatory Visit (INDEPENDENT_AMBULATORY_CARE_PROVIDER_SITE_OTHER): Payer: 59

## 2015-03-31 VITALS — BP 144/82 | HR 95 | Temp 97.3°F | Ht 65.0 in | Wt 165.0 lb

## 2015-03-31 DIAGNOSIS — E785 Hyperlipidemia, unspecified: Secondary | ICD-10-CM | POA: Diagnosis not present

## 2015-03-31 DIAGNOSIS — I10 Essential (primary) hypertension: Secondary | ICD-10-CM

## 2015-03-31 DIAGNOSIS — Z1159 Encounter for screening for other viral diseases: Secondary | ICD-10-CM | POA: Diagnosis not present

## 2015-03-31 DIAGNOSIS — E039 Hypothyroidism, unspecified: Secondary | ICD-10-CM

## 2015-03-31 DIAGNOSIS — Z1212 Encounter for screening for malignant neoplasm of rectum: Secondary | ICD-10-CM | POA: Diagnosis not present

## 2015-03-31 DIAGNOSIS — Z6828 Body mass index (BMI) 28.0-28.9, adult: Secondary | ICD-10-CM | POA: Diagnosis not present

## 2015-03-31 MED ORDER — LEVOTHYROXINE SODIUM 75 MCG PO TABS: ORAL_TABLET | ORAL | Status: DC

## 2015-03-31 MED ORDER — ROSUVASTATIN CALCIUM 10 MG PO TABS: 10.0000 mg | ORAL_TABLET | Freq: Every day | ORAL | Status: DC

## 2015-03-31 MED ORDER — HYDROCHLOROTHIAZIDE 25 MG PO TABS: ORAL_TABLET | ORAL | Status: DC

## 2015-03-31 NOTE — Progress Notes (Signed)
Subjective:    Patient ID: Teresa Ayala, female    DOB: [birth year], 60 y.o.   MRN: [id number]  Patient here today for follow up of chronic medical problems.  Outpatient Encounter Prescriptions as of 03/31/2015  Medication Sig  . aspirin EC 81 MG tablet Take 81 mg by mouth daily.  . hydrochlorothiazide (HYDRODIURIL) 25 MG tablet TAKE 1 TABLET (25 MG TOTAL) BY MOUTH DAILY.  Marland Kitchen levothyroxine (SYNTHROID, LEVOTHROID) 75 MCG tablet TAKE 1 TABLET BY MOUTH EVERY DAY BEFORE BREAKFAST  . rosuvastatin (CRESTOR) 10 MG tablet Take 1 tablet (10 mg total) by mouth daily.   No facility-administered encounter medications on file as of 03/31/2015.     Hypertension This is a chronic problem. The current episode started more than 1 year ago. The problem is controlled. Pertinent negatives include no chest pain, headaches, neck pain, palpitations or shortness of breath. Risk factors for coronary artery disease include dyslipidemia. Past treatments include diuretics. The current treatment provides moderate improvement. Compliance problems include diet.  Hypertensive end-organ damage includes a thyroid problem.  Hyperlipidemia This is a chronic problem. The current episode started more than 1 year ago. The problem is controlled. Recent lipid tests were reviewed and are normal. Exacerbating diseases include hypothyroidism. She has no history of diabetes or obesity. Pertinent negatives include no chest pain or shortness of breath. Current antihyperlipidemic treatment includes statins. Risk factors for coronary artery disease include hypertension and dyslipidemia.  Thyroid Problem Presents for follow-up (hypothyroidism) visit. Patient reports no cold intolerance, diarrhea, menstrual problem, palpitations or visual change. The symptoms have been improving. Her past medical history is significant for hyperlipidemia. There is no history of diabetes.      Review of Systems  Respiratory: Negative for shortness of breath.    Cardiovascular: Negative for chest pain and palpitations.  Gastrointestinal: Negative for diarrhea.  Endocrine: Negative for cold intolerance.  Genitourinary: Negative for menstrual problem.  Musculoskeletal: Negative for neck pain.  Neurological: Negative for headaches.  All other systems reviewed and are negative.      Objective:   Physical Exam  Constitutional: She is oriented to person, place, and time. She appears well-developed and well-nourished.  HENT:  Nose: Nose normal.  Mouth/Throat: Oropharynx is clear and moist.  Eyes: EOM are normal.  Neck: Trachea normal, normal range of motion and full passive range of motion without pain. Neck supple. No JVD present. Carotid bruit is not present. No thyromegaly present.  Cardiovascular: Normal rate, regular rhythm, normal heart sounds and intact distal pulses.  Exam reveals no gallop and no friction rub.   No murmur heard. Pulmonary/Chest: Effort normal and breath sounds normal.  Abdominal: Soft. Bowel sounds are normal. She exhibits no distension and no mass. There is no tenderness.  Musculoskeletal: Normal range of motion.  Lymphadenopathy:    She has no cervical adenopathy.  Neurological: She is alert and oriented to person, place, and time. She has normal reflexes.  Skin: Skin is warm and dry.  Psychiatric: She has a normal mood and affect. Her behavior is normal. Judgment and thought content normal.   BP 144/82 mmHg  Pulse 95  Temp(Src) 97.3 F (36.3 C) (Oral)  Ht _0  (1.651 m)  Wt 165 lb (74.844 kg)  BMI 27.46 kg/m2  Chest xray- no cardiopulmonary abnormalities-Preliminary reading by Ronnald Collum, FNP  [facility]   EKG- NSR- Mary-Margaret Hassell Done, FNP      Assessment & Plan:  1. Essential hypertension Do not add salt to diet - CMP14+EGFR - hydrochlorothiazide (  HYDRODIURIL) 25 MG tablet; TAKE 1 TABLET (25 MG TOTAL) BY MOUTH DAILY.  Dispense: 90 tablet; Refill: 1 - DG Chest 2 View; Future - EKG 12-Lead  2.  Hypothyroidism, unspecified hypothyroidism type - levothyroxine (SYNTHROID, LEVOTHROID) 75 MCG tablet; TAKE 1 TABLET BY MOUTH EVERY DAY BEFORE BREAKFAST  Dispense: 90 tablet; Refill: 1  3. Hyperlipidemia with target LDL less than 100 Low fat diet - Lipid panel - rosuvastatin (CRESTOR) 10 MG tablet; Take 1 tablet (10 mg total) by mouth daily.  Dispense: 90 tablet; Refill: 1  4. BMI 28.0-28.9,adult Discussed diet and exercise for person with BMI >25 Will recheck weight in 3-6 months   5. Need for hepatitis C screening test - Hepatitis C antibody  6. Screening for malignant neoplasm of the rectum - Fecal occult blood, imunochemical; Future - Ambulatory referral to Gastroenterology    Labs pending Health maintenance reviewed Diet and exercise encouraged Continue all meds Follow up  In 6 months   [place], FNP

## 2015-03-31 NOTE — Patient Instructions (Signed)
Health Maintenance, Female Adopting a healthy lifestyle and getting preventive care can go a long way to promote health and wellness. Talk with your health care provider about what schedule of regular examinations is right for you. This is a good chance for you to check in with your provider about disease prevention and staying healthy. In between checkups, there are plenty of things you can do on your own. Experts have done a lot of research about which lifestyle changes and preventive measures are most likely to keep you healthy. Ask your health care provider for more information. WEIGHT AND DIET  Eat a healthy diet  Be sure to include plenty of vegetables, fruits, low-fat dairy products, and lean protein.  Do not eat a lot of foods high in solid fats, added sugars, or salt.  Get regular exercise. This is one of the most important things you can do for your health.  Most adults should exercise for at least 150 minutes each week. The exercise should increase your heart rate and make you sweat (moderate-intensity exercise).  Most adults should also do strengthening exercises at least twice a week. This is in addition to the moderate-intensity exercise.  Maintain a healthy weight  Body mass index (BMI) is a measurement that can be used to identify possible weight problems. It estimates body fat based on height and weight. Your health care provider can help determine your BMI and help you achieve or maintain a healthy weight.  For females 20 years of age and older:   A BMI below 18.5 is considered underweight.  A BMI of 18.5 to 24.9 is normal.  A BMI of 25 to 29.9 is considered overweight.  A BMI of 30 and above is considered obese.  Watch levels of cholesterol and blood lipids  You should start having your blood tested for lipids and cholesterol at 59 years of age, then have this test every 5 years.  You may need to have your cholesterol levels checked more often if:  Your lipid  or cholesterol levels are high.  You are older than 59 years of age.  You are at high risk for heart disease.  CANCER SCREENING   Lung Cancer  Lung cancer screening is recommended for adults 55-80 years old who are at high risk for lung cancer because of a history of smoking.  A yearly low-dose CT scan of the lungs is recommended for people who:  Currently smoke.  Have quit within the past 15 years.  Have at least a 30-pack-year history of smoking. A pack year is smoking an average of one pack of cigarettes a day for 1 year.  Yearly screening should continue until it has been 15 years since you quit.  Yearly screening should stop if you develop a health problem that would prevent you from having lung cancer treatment.  Breast Cancer  Practice breast self-awareness. This means understanding how your breasts normally appear and feel.  It also means doing regular breast self-exams. Let your health care provider know about any changes, no matter how small.  If you are in your 20s or 30s, you should have a clinical breast exam (CBE) by a health care provider every 1-3 years as part of a regular health exam.  If you are 40 or older, have a CBE every year. Also consider having a breast X-ray (mammogram) every year.  If you have a family history of breast cancer, talk to your health care provider about genetic screening.  If you   are at high risk for breast cancer, talk to your health care provider about having an MRI and a mammogram every year.  Breast cancer gene (BRCA) assessment is recommended for women who have family members with BRCA-related cancers. BRCA-related cancers include:  Breast.  Ovarian.  Tubal.  Peritoneal cancers.  Results of the assessment will determine the need for genetic counseling and BRCA1 and BRCA2 testing. Cervical Cancer Your health care provider may recommend that you be screened regularly for cancer of the pelvic organs (ovaries, uterus, and  vagina). This screening involves a pelvic examination, including checking for microscopic changes to the surface of your cervix (Pap test). You may be encouraged to have this screening done every 3 years, beginning at age 21.  For women ages 30-65, health care providers may recommend pelvic exams and Pap testing every 3 years, or they may recommend the Pap and pelvic exam, combined with testing for human papilloma virus (HPV), every 5 years. Some types of HPV increase your risk of cervical cancer. Testing for HPV may also be done on women of any age with unclear Pap test results.  Other health care providers may not recommend any screening for nonpregnant women who are considered low risk for pelvic cancer and who do not have symptoms. Ask your health care provider if a screening pelvic exam is right for you.  If you have had past treatment for cervical cancer or a condition that could lead to cancer, you need Pap tests and screening for cancer for at least 20 years after your treatment. If Pap tests have been discontinued, your risk factors (such as having a new sexual partner) need to be reassessed to determine if screening should resume. Some women have medical problems that increase the chance of getting cervical cancer. In these cases, your health care provider may recommend more frequent screening and Pap tests. Colorectal Cancer  This type of cancer can be detected and often prevented.  Routine colorectal cancer screening usually begins at 59 years of age and continues through 59 years of age.  Your health care provider may recommend screening at an earlier age if you have risk factors for colon cancer.  Your health care provider may also recommend using home test kits to check for hidden blood in the stool.  A small camera at the end of a tube can be used to examine your colon directly (sigmoidoscopy or colonoscopy). This is done to check for the earliest forms of colorectal  cancer.  Routine screening usually begins at age 50.  Direct examination of the colon should be repeated every 5-10 years through 59 years of age. However, you may need to be screened more often if early forms of precancerous polyps or small growths are found. Skin Cancer  Check your skin from head to toe regularly.  Tell your health care provider about any new moles or changes in moles, especially if there is a change in a mole's shape or color.  Also tell your health care provider if you have a mole that is larger than the size of a pencil eraser.  Always use sunscreen. Apply sunscreen liberally and repeatedly throughout the day.  Protect yourself by wearing long sleeves, pants, a wide-brimmed hat, and sunglasses whenever you are outside. HEART DISEASE, DIABETES, AND HIGH BLOOD PRESSURE   High blood pressure causes heart disease and increases the risk of stroke. High blood pressure is more likely to develop in:  People who have blood pressure in the high end   of the normal range (130-139/85-89 mm Hg).  People who are overweight or obese.  People who are African American.  If you are 38-23 years of age, have your blood pressure checked every 3-5 years. If you are 61 years of age or older, have your blood pressure checked every year. You should have your blood pressure measured twice--once when you are at a hospital or clinic, and once when you are not at a hospital or clinic. Record the average of the two measurements. To check your blood pressure when you are not at a hospital or clinic, you can use:  An automated blood pressure machine at a pharmacy.  A home blood pressure monitor.  If you are between 45 years and 39 years old, ask your health care provider if you should take aspirin to prevent strokes.  Have regular diabetes screenings. This involves taking a blood sample to check your fasting blood sugar level.  If you are at a normal weight and have a low risk for diabetes,  have this test once every three years after 59 years of age.  If you are overweight and have a high risk for diabetes, consider being tested at a younger age or more often. PREVENTING INFECTION  Hepatitis B  If you have a higher risk for hepatitis B, you should be screened for this virus. You are considered at high risk for hepatitis B if:  You were born in a country where hepatitis B is common. Ask your health care provider which countries are considered high risk.  Your parents were born in a high-risk country, and you have not been immunized against hepatitis B (hepatitis B vaccine).  You have HIV or AIDS.  You use needles to inject street drugs.  You live with someone who has hepatitis B.  You have had sex with someone who has hepatitis B.  You get hemodialysis treatment.  You take certain medicines for conditions, including cancer, organ transplantation, and autoimmune conditions. Hepatitis C  Blood testing is recommended for:  Everyone born from [birth year] through 1964.  Anyone with known risk factors for hepatitis C. Sexually transmitted infections (STIs)  You should be screened for sexually transmitted infections (STIs) including gonorrhea and chlamydia if:  You are sexually active and are younger than 59 years of age.  You are older than 59 years of age and your health care provider tells you that you are at risk for this type of infection.  Your sexual activity has changed since you were last screened and you are at an increased risk for chlamydia or gonorrhea. Ask your health care provider if you are at risk.  If you do not have HIV, but are at risk, it may be recommended that you take a prescription medicine daily to prevent HIV infection. This is called pre-exposure prophylaxis (PrEP). You are considered at risk if:  You are sexually active and do not regularly use condoms or know the HIV status of your partner(s).  You take drugs by injection.  You are sexually  active with a partner who has HIV. Talk with your health care provider about whether you are at high risk of being infected with HIV. If you choose to begin PrEP, you should first be tested for HIV. You should then be tested every 3 months for as long as you are taking PrEP.  PREGNANCY   If you are premenopausal and you may become pregnant, ask your health care provider about preconception counseling.  If you may  become pregnant, take 400 to 800 micrograms (mcg) of folic acid every day.  If you want to prevent pregnancy, talk to your health care provider about birth control (contraception). OSTEOPOROSIS AND MENOPAUSE   Osteoporosis is a disease in which the bones lose minerals and strength with aging. This can result in serious bone fractures. Your risk for osteoporosis can be identified using a bone density scan.  If you are 61 years of age or older, or if you are at risk for osteoporosis and fractures, ask your health care provider if you should be screened.  Ask your health care provider whether you should take a calcium or vitamin D supplement to lower your risk for osteoporosis.  Menopause may have certain physical symptoms and risks.  Hormone replacement therapy may reduce some of these symptoms and risks. Talk to your health care provider about whether hormone replacement therapy is right for you.  HOME CARE INSTRUCTIONS   Schedule regular health, dental, and eye exams.  Stay current with your immunizations.   Do not use any tobacco products including cigarettes, chewing tobacco, or electronic cigarettes.  If you are pregnant, do not drink alcohol.  If you are breastfeeding, limit how much and how often you drink alcohol.  Limit alcohol intake to no more than 1 drink per day for nonpregnant women. One drink equals 12 ounces of beer, 5 ounces of wine, or 1 ounces of hard liquor.  Do not use street drugs.  Do not share needles.  Ask your health care provider for help if  you need support or information about quitting drugs.  Tell your health care provider if you often feel depressed.  Tell your health care provider if you have ever been abused or do not feel safe at home.   This information is not intended to replace advice given to you by your health care provider. Make sure you discuss any questions you have with your health care provider.   Document Released: 07/24/2010 Document Revised: 01/29/2014 Document Reviewed: 12/10/2012 Elsevier Interactive Patient Education [employer].

## 2015-04-01 LAB — CMP14+EGFR
ALBUMIN: 4.3 g/dL (ref 3.5–5.5)
ALK PHOS: 68 IU/L (ref 39–117)
ALT: 22 IU/L (ref 0–32)
AST: 25 IU/L (ref 0–40)
Albumin/Globulin Ratio: 1.4 (ref 1.1–2.5)
BILIRUBIN TOTAL: 0.5 mg/dL (ref 0.0–1.2)
BUN/Creatinine Ratio: 16 (ref 9–23)
BUN: 16 mg/dL (ref 6–24)
CO2: 26 mmol/L (ref 18–29)
CREATININE: 1.02 mg/dL — AB (ref 0.57–1.00)
Calcium: 9.4 mg/dL (ref 8.7–10.2)
Chloride: 99 mmol/L (ref 96–106)
GFR, EST AFRICAN AMERICAN: 70 mL/min/{1.73_m2} (ref >59)
GFR, EST NON AFRICAN AMERICAN: 61 mL/min/{1.73_m2} (ref >59)
GLUCOSE: 108 mg/dL — AB (ref 65–99)
Globulin, Total: 3.1 g/dL (ref 1.5–4.5)
Potassium: 4.1 mmol/L (ref 3.5–5.2)
Sodium: 140 mmol/L (ref 134–144)
Total Protein: 7.4 g/dL (ref 6.0–8.5)

## 2015-04-01 LAB — HEPATITIS C ANTIBODY

## 2015-04-01 LAB — LIPID PANEL
CHOL/HDL RATIO: 3 ratio (ref 0.0–4.4)
Cholesterol, Total: 134 mg/dL (ref 100–199)
HDL: 45 mg/dL (ref >39)
LDL CALC: 64 mg/dL (ref 0–99)
Triglycerides: 124 mg/dL (ref 0–149)
VLDL Cholesterol Cal: 25 mg/dL (ref 5–40)

## 2015-05-09 ENCOUNTER — Ambulatory Visit (AMBULATORY_SURGERY_CENTER): Payer: Self-pay | Admitting: *Deleted

## 2015-05-09 VITALS — Ht 64.0 in | Wt 169.0 lb

## 2015-05-09 DIAGNOSIS — Z1211 Encounter for screening for malignant neoplasm of colon: Secondary | ICD-10-CM

## 2015-05-09 NOTE — Progress Notes (Signed)
Patient denies any allergies to eggs or soy. Patient denies any problems with anesthesia/sedation. Patient denies any oxygen use at home and does not take any diet/weight loss medications. EMMI education assisgned to patient on colonoscopy, this was explained and instructions given to patient. 

## 2015-05-23 ENCOUNTER — Encounter: Payer: Self-pay | Admitting: Internal Medicine

## 2015-05-23 ENCOUNTER — Ambulatory Visit (AMBULATORY_SURGERY_CENTER): Payer: 59 | Admitting: Internal Medicine

## 2015-05-23 VITALS — BP 132/82 | HR 73 | Temp 98.6°F | Resp 12 | Ht 64.0 in | Wt 169.0 lb

## 2015-05-23 DIAGNOSIS — Z1211 Encounter for screening for malignant neoplasm of colon: Secondary | ICD-10-CM | POA: Diagnosis not present

## 2015-05-23 MED ORDER — SODIUM CHLORIDE 0.9 % IV SOLN: 500.0000 mL | INTRAVENOUS | Status: DC

## 2015-05-23 NOTE — Progress Notes (Signed)
Report given to PACU RN, vss 

## 2015-05-23 NOTE — Op Note (Addendum)
[facility] Patient Name: Teresa Ayala Procedure Date: 05/23/2015 11:28 AM MRN: [id number] Endoscopist: Iva Boop , MD Age: 60 Date of Birth: Jul 18, 1955 Gender: Female Procedure:                Colonoscopy Indications:              Screening for colorectal malignant neoplasm Medicines:                Propofol per Anesthesia, Monitored Anesthesia Care Procedure:                Pre-Anesthesia Assessment:                           - Prior to the procedure, a History and Physical                            was performed, and patient medications and                            allergies were reviewed. The patient's tolerance of                            previous anesthesia was also reviewed. The risks                            and benefits of the procedure and the sedation                            options and risks were discussed with the patient.                            All questions were answered, and informed consent                            was obtained. Prior Anticoagulants: The patient has                            taken no previous anticoagulant or antiplatelet                            agents. ASA Grade Assessment: II - A patient with                            mild systemic disease. After reviewing the risks                            and benefits, the patient was deemed in                            satisfactory condition to undergo the procedure.                           After obtaining informed consent, the colonoscope  was passed under direct vision. Throughout the                            procedure, the patient's blood pressure, pulse, and                            oxygen saturations were monitored continuously. The                            Model CF-HQ190L [id number](SN#[id number]) scope was introduced                            through the anus and advanced to the the cecum,                            identified by appendiceal orifice  and ileocecal                            valve. The quality of the bowel preparation was                            excellent. The colonoscopy was performed without                            difficulty. The patient tolerated the procedure                            well. The bowel preparation used was Miralax. The                            ileocecal valve, appendiceal orifice, and rectum                            were photographed. Scope In: 11:43:04 AM Scope Out: 11:53:58 AM Scope Withdrawal Time: 0 hours 7 minutes 6 seconds  Total Procedure Duration: 0 hours 10 minutes 54 seconds  Findings:                 The colon (entire examined portion) appeared normal.                           No additional abnormalities were found on                            retroflexion. Complications:            No immediate complications. Estimated blood loss:                            None. Estimated Blood Loss:     Estimated blood loss: none. Recommendation:           - Repeat colonoscopy in 10 years for screening                            purposes.                           -  Resume previous diet.                           - Patient has a contact number available for                            emergencies. The signs and symptoms of potential                            delayed complications were discussed with the                            patient. Return to normal activities tomorrow.                            Written discharge instructions were provided to the                            patient.                           - Resume previous diet.                           - Continue present medications. Iva Boop, MD 05/23/2015 11:57:16 AM This report has been signed electronically. CC Letter to:             Bennie Pierini, NP Addendum Number: 1   Addendum Date: 05/23/2015 3:24:49 PM      Normal perianal and digital rectal exams. Iva Boop, MD 05/23/2015 3:25:01 PM This report has  been signed electronically.

## 2015-05-23 NOTE — Patient Instructions (Addendum)
Colonoscopy was normal! No polyps, no cancer seen.  Next routine colonoscopy in 10 years - 2027  I appreciate the opportunity to care for you. Iva Booparl E. Skyler Carel, MD, [facility]   Discharge instructions given. Normal exam. Resume previous medications. YOU HAD AN ENDOSCOPIC PROCEDURE TODAY AT THE [facility]:   Refer to the procedure report that was given to you for any specific questions about what was found during the examination.  If the procedure report does not answer your questions, please call your gastroenterologist to clarify.  If you requested that your care partner not be given the details of your procedure findings, then the procedure report has been included in a sealed envelope for you to review at your convenience later.  YOU SHOULD EXPECT: Some feelings of bloating in the abdomen. Passage of more gas than usual.  Walking can help get rid of the air that was put into your GI tract during the procedure and reduce the bloating. If you had a lower endoscopy (such as a colonoscopy or flexible sigmoidoscopy) you may notice spotting of blood in your stool or on the toilet paper. If you underwent a bowel prep for your procedure, you may not have a normal bowel movement for a few days.  Please Note:  You might notice some irritation and congestion in your nose or some drainage.  This is from the oxygen used during your procedure.  There is no need for concern and it should clear up in a day or so.  SYMPTOMS TO REPORT IMMEDIATELY:   Following lower endoscopy (colonoscopy or flexible sigmoidoscopy):  Excessive amounts of blood in the stool  Significant tenderness or worsening of abdominal pains  Swelling of the abdomen that is new, acute  Fever of 100F or higher   For urgent or emergent issues, a gastroenterologist can be reached at any hour by calling [phone].   DIET: Your first meal following the procedure should be a small meal and then it is ok to progress  to your normal diet. Heavy or fried foods are harder to digest and may make you feel nauseous or bloated.  Likewise, meals heavy in dairy and vegetables can increase bloating.  Drink plenty of fluids but you should avoid alcoholic beverages for 24 hours.  ACTIVITY:  You should plan to take it easy for the rest of today and you should NOT DRIVE or use heavy machinery until tomorrow (because of the sedation medicines used during the test).    FOLLOW UP: Our staff will call the number listed on your records the next business day following your procedure to check on you and address any questions or concerns that you may have regarding the information given to you following your procedure. If we do not reach you, we will leave a message.  However, if you are feeling well and you are not experiencing any problems, there is no need to return our call.  We will assume that you have returned to your regular daily activities without incident.  If any biopsies were taken you will be contacted by phone or by letter within the next 1-3 weeks.  Please call us at [phone] if you have not heard about the biopsies in 3 weeks.    SIGNATURES/CONFIDENTIALITY: You and/or your care partner have signed paperwork which will be entered into your electronic medical record.  These signatures attest to the fact that that the information above on your After Visit Summary has been reviewed and is  understood.  Full responsibility of the confidentiality of this discharge information lies with you and/or your care-partner. 

## 2015-05-24 ENCOUNTER — Telehealth: Payer: Self-pay

## 2015-05-24 NOTE — Telephone Encounter (Signed)
  Follow up Call-  Call back number 05/23/2015  Post procedure Call Back phone  # [phone]  Permission to leave phone message Yes     Patient questions:  Do you have a fever, pain , or abdominal swelling? No. Pain Score  0 *  Have you tolerated food without any problems? Yes.    Have you been able to return to your normal activities? Yes.    Do you have any questions about your discharge instructions: Diet   No. Medications  No. Follow up visit  No.  Do you have questions or concerns about your Care? No.  Actions: * If pain score is 4 or above: No action needed, pain <4.

## 2015-06-25 ENCOUNTER — Other Ambulatory Visit: Payer: Self-pay | Admitting: Nurse Practitioner

## 2015-09-23 ENCOUNTER — Other Ambulatory Visit: Payer: Self-pay | Admitting: Nurse Practitioner

## 2015-10-04 ENCOUNTER — Encounter: Payer: Self-pay | Admitting: Nurse Practitioner

## 2015-10-04 ENCOUNTER — Ambulatory Visit (INDEPENDENT_AMBULATORY_CARE_PROVIDER_SITE_OTHER): Payer: Managed Care, Other (non HMO) | Admitting: Nurse Practitioner

## 2015-10-04 VITALS — BP 138/78 | HR 81 | Temp 97.3°F | Ht 64.0 in | Wt 171.0 lb

## 2015-10-04 DIAGNOSIS — E039 Hypothyroidism, unspecified: Secondary | ICD-10-CM

## 2015-10-04 DIAGNOSIS — I10 Essential (primary) hypertension: Secondary | ICD-10-CM

## 2015-10-04 DIAGNOSIS — E785 Hyperlipidemia, unspecified: Secondary | ICD-10-CM | POA: Diagnosis not present

## 2015-10-04 DIAGNOSIS — Z6828 Body mass index (BMI) 28.0-28.9, adult: Secondary | ICD-10-CM | POA: Diagnosis not present

## 2015-10-04 MED ORDER — LEVOTHYROXINE SODIUM 75 MCG PO TABS: 75.0000 ug | ORAL_TABLET | Freq: Every day | ORAL | 1 refills | Status: DC

## 2015-10-04 MED ORDER — ROSUVASTATIN CALCIUM 10 MG PO TABS: 10.0000 mg | ORAL_TABLET | Freq: Every day | ORAL | 1 refills | Status: DC

## 2015-10-04 MED ORDER — HYDROCHLOROTHIAZIDE 25 MG PO TABS: ORAL_TABLET | ORAL | 1 refills | Status: DC

## 2015-10-04 NOTE — Progress Notes (Signed)
Subjective:    Patient ID: Teresa Ayala, female    DOB: 05-05-1955, 60 y.o.   MRN: [id number]  Patient here today for follow up of chronic medical problems. She was last seen 6 months ago. She had a colonoscopy 4 months ago, patient reports no problem. Has appointment next month for her routine mammogram.  Outpatient Encounter Prescriptions as of 10/04/2015  Medication Sig  . aspirin EC 81 MG tablet Take 81 mg by mouth daily.  . B Complex-C (SUPER B COMPLEX PO) Take 1 tablet by mouth daily.  . Coenzyme Q10 (CO Q 10) 100 MG CAPS Take 200 mg by mouth daily.  . Glucosamine-Chondroit-Vit C-Mn (GLUCOSAMINE CHONDR 1500 COMPLX PO) Take 1 capsule by mouth daily.  . hydrochlorothiazide (HYDRODIURIL) 25 MG tablet TAKE 1 TABLET (25 MG TOTAL) BY MOUTH DAILY.  Marland Kitchen levothyroxine (SYNTHROID, LEVOTHROID) 75 MCG tablet TAKE 1 TABLET BY MOUTH EVERY DAY BEFORE BREAKFAST  . loratadine (CLARITIN) 10 MG tablet Take 10 mg by mouth daily.  . Lutein-Zeaxanthin 25-5 MG CAPS Take 1 tablet by mouth daily.  . Multiple Vitamins-Minerals (DAILY MULTIVITAMIN PO) Take 1 tablet by mouth daily.  . Omega-3 Fatty Acids (FISH OIL TRIPLE STRENGTH) 1400 MG CAPS Take 1 capsule by mouth daily.  . rosuvastatin (CRESTOR) 10 MG tablet Take 1 tablet (10 mg total) by mouth daily.  . [DISCONTINUED] levothyroxine (SYNTHROID, LEVOTHROID) 75 MCG tablet TAKE 1 TABLET BY MOUTH EVERY DAY BEFORE BREAKFAST   No facility-administered encounter medications on file as of 10/04/2015.      Hypertension  This is a chronic problem. The current episode started more than 1 year ago. The problem is controlled. Pertinent negatives include no chest pain, headaches, neck pain, palpitations or shortness of breath. Risk factors for coronary artery disease include dyslipidemia. Past treatments include diuretics. The current treatment provides moderate improvement. Compliance problems include diet.  Hypertensive end-organ damage includes a thyroid problem.   Hyperlipidemia  This is a chronic problem. The current episode started more than 1 year ago. The problem is controlled. Recent lipid tests were reviewed and are normal. Exacerbating diseases include hypothyroidism. She has no history of diabetes or obesity. Pertinent negatives include no chest pain or shortness of breath. Current antihyperlipidemic treatment includes statins. Risk factors for coronary artery disease include hypertension and dyslipidemia.  Thyroid Problem  Presents for follow-up (hypothyroidism) visit. Patient reports no cold intolerance, diarrhea, menstrual problem, palpitations or visual change. The symptoms have been improving. Her past medical history is significant for hyperlipidemia. There is no history of diabetes.      Review of Systems  Respiratory: Negative for shortness of breath.   Cardiovascular: Negative for chest pain and palpitations.  Gastrointestinal: Negative for diarrhea.  Endocrine: Negative for cold intolerance.  Genitourinary: Negative for menstrual problem.  Musculoskeletal: Negative for neck pain.  Neurological: Negative for headaches.  All other systems reviewed and are negative.      Objective:   Physical Exam  Constitutional: She is oriented to person, place, and time. She appears well-developed and well-nourished.  HENT:  Nose: Nose normal.  Mouth/Throat: Oropharynx is clear and moist.  Eyes: EOM are normal.  Neck: Trachea normal, normal range of motion and full passive range of motion without pain. Neck supple. No JVD present. Carotid bruit is not present. No thyromegaly present.  Cardiovascular: Normal rate, regular rhythm, normal heart sounds and intact distal pulses.  Exam reveals no gallop and no friction rub.   No murmur heard. Pulmonary/Chest: Effort normal and breath sounds  normal.  Abdominal: Soft. Bowel sounds are normal. She exhibits no distension and no mass. There is no tenderness.  Musculoskeletal: Normal range of motion.   Lymphadenopathy:    She has no cervical adenopathy.  Neurological: She is alert and oriented to person, place, and time. She has normal reflexes.  Skin: Skin is warm and dry.  Psychiatric: She has a normal mood and affect. Her behavior is normal. Judgment and thought content normal.   BP 138/78 (BP Location: Left Arm, Cuff Size: Normal)   Pulse 81   Temp 97.3 F (36.3 C) (Oral)   Ht _0  (1.626 m)   Wt 171 lb (77.6 kg)   BMI 29.35 kg/m      Assessment & Plan:   1. Essential hypertension Do not add salt to diet - CMP14+EGFR  2. Hypothyroidism, unspecified hypothyroidism type - TSH  3. Hyperlipidemia with target LDL less than 100 Continue low fat diet - Lipid Panel  4. BMI 28.0-28.9,adult Weight loss and AHA exercise regimen discussed  Meds ordered this encounter  Medications  . rosuvastatin (CRESTOR) 10 MG tablet    Sig: Take 1 tablet (10 mg total) by mouth daily.    Dispense:  90 tablet    Refill:  1    Order Specific Question:   Supervising Provider    Answer:   VINCENT, CAROL L [[id number]]  . hydrochlorothiazide (HYDRODIURIL) 25 MG tablet    Sig: TAKE 1 TABLET (25 MG TOTAL) BY MOUTH DAILY.    Dispense:  90 tablet    Refill:  1    Order Specific Question:   Supervising Provider    Answer:   VINCENT, CAROL L [[id number]]  . levothyroxine (SYNTHROID, LEVOTHROID) 75 MCG tablet    Sig: Take 1 tablet (75 mcg total) by mouth daily before breakfast.    Dispense:  90 tablet    Refill:  1    Order Specific Question:   Supervising Provider    Answer:   Eustaquio Maize [[id number]]     Mammogram scheduled for next month Labs pending Health maintenance reviewed Diet and exercise encouraged Continue all meds Follow up  In 6 months   [place], FNP

## 2015-10-04 NOTE — Patient Instructions (Signed)

## 2015-10-05 LAB — CMP14+EGFR
ALK PHOS: 64 IU/L (ref 39–117)
ALT: 21 IU/L (ref 0–32)
AST: 24 IU/L (ref 0–40)
Albumin/Globulin Ratio: 1.4 (ref 1.2–2.2)
Albumin: 4.1 g/dL (ref 3.5–5.5)
BUN/Creatinine Ratio: 16 (ref 9–23)
BUN: 16 mg/dL (ref 6–24)
Bilirubin Total: 0.4 mg/dL (ref 0.0–1.2)
CO2: 27 mmol/L (ref 18–29)
CREATININE: 0.98 mg/dL (ref 0.57–1.00)
Calcium: 9.1 mg/dL (ref 8.7–10.2)
Chloride: 100 mmol/L (ref 96–106)
GFR calc Af Amer: 73 mL/min/{1.73_m2} (ref >59)
GFR calc non Af Amer: 63 mL/min/{1.73_m2} (ref >59)
GLOBULIN, TOTAL: 2.9 g/dL (ref 1.5–4.5)
GLUCOSE: 95 mg/dL (ref 65–99)
Potassium: 4.3 mmol/L (ref 3.5–5.2)
SODIUM: 140 mmol/L (ref 134–144)
Total Protein: 7 g/dL (ref 6.0–8.5)

## 2015-10-05 LAB — LIPID PANEL
Chol/HDL Ratio: 2.6 ratio units (ref 0.0–4.4)
Cholesterol, Total: 122 mg/dL (ref 100–199)
HDL: 47 mg/dL (ref >39)
LDL CALC: 51 mg/dL (ref 0–99)
Triglycerides: 118 mg/dL (ref 0–149)
VLDL CHOLESTEROL CAL: 24 mg/dL (ref 5–40)

## 2015-10-05 LAB — TSH: TSH: 4 u[IU]/mL (ref 0.450–4.500)

## 2015-12-23 ENCOUNTER — Other Ambulatory Visit: Payer: Self-pay | Admitting: Nurse Practitioner

## 2016-04-02 ENCOUNTER — Encounter: Payer: Self-pay | Admitting: Nurse Practitioner

## 2016-04-02 ENCOUNTER — Ambulatory Visit (INDEPENDENT_AMBULATORY_CARE_PROVIDER_SITE_OTHER): Payer: Managed Care, Other (non HMO) | Admitting: Nurse Practitioner

## 2016-04-02 VITALS — BP 136/82 | HR 81 | Temp 97.6°F | Ht 64.0 in | Wt 172.0 lb

## 2016-04-02 DIAGNOSIS — Z6829 Body mass index (BMI) 29.0-29.9, adult: Secondary | ICD-10-CM | POA: Diagnosis not present

## 2016-04-02 DIAGNOSIS — I10 Essential (primary) hypertension: Secondary | ICD-10-CM | POA: Diagnosis not present

## 2016-04-02 DIAGNOSIS — E785 Hyperlipidemia, unspecified: Secondary | ICD-10-CM

## 2016-04-02 DIAGNOSIS — E039 Hypothyroidism, unspecified: Secondary | ICD-10-CM | POA: Diagnosis not present

## 2016-04-02 MED ORDER — HYDROCHLOROTHIAZIDE 25 MG PO TABS: ORAL_TABLET | ORAL | 1 refills | Status: DC

## 2016-04-02 MED ORDER — ROSUVASTATIN CALCIUM 10 MG PO TABS: 10.0000 mg | ORAL_TABLET | Freq: Every day | ORAL | 1 refills | Status: DC

## 2016-04-02 MED ORDER — LEVOTHYROXINE SODIUM 75 MCG PO TABS: 75.0000 ug | ORAL_TABLET | Freq: Every day | ORAL | 1 refills | Status: DC

## 2016-04-02 NOTE — Progress Notes (Signed)
Subjective:    Patient ID: Teresa Ayala, female    DOB: 1955-03-17, 61 y.o.   MRN: [id number]  Patient here today for follow up of chronic medical problems. No chnages since last visit. No complaints today.  Outpatient Encounter Prescriptions as of 04/02/2016  Medication Sig  . aspirin EC 81 MG tablet Take 81 mg by mouth daily.  . B Complex-C (SUPER B COMPLEX PO) Take 1 tablet by mouth daily.  . Coenzyme Q10 (CO Q 10) 100 MG CAPS Take 200 mg by mouth daily.  . Glucosamine-Chondroit-Vit C-Mn (GLUCOSAMINE CHONDR 1500 COMPLX PO) Take 1 capsule by mouth daily.  . hydrochlorothiazide (HYDRODIURIL) 25 MG tablet TAKE 1 TABLET (25 MG TOTAL) BY MOUTH DAILY.  Marland Kitchen levothyroxine (SYNTHROID, LEVOTHROID) 75 MCG tablet Take 1 tablet (75 mcg total) by mouth daily before breakfast.  . loratadine (CLARITIN) 10 MG tablet Take 10 mg by mouth daily.  . Lutein-Zeaxanthin 25-5 MG CAPS Take 1 tablet by mouth daily.  . Multiple Vitamins-Minerals (DAILY MULTIVITAMIN PO) Take 1 tablet by mouth daily.  . Omega-3 Fatty Acids (FISH OIL TRIPLE STRENGTH) 1400 MG CAPS Take 1 capsule by mouth daily.  . rosuvastatin (CRESTOR) 10 MG tablet Take 1 tablet (10 mg total) by mouth daily.      Hypertension  This is a chronic problem. The current episode started more than 1 year ago. The problem is controlled. Pertinent negatives include no chest pain, headaches, neck pain, palpitations or shortness of breath. Risk factors for coronary artery disease include dyslipidemia. Past treatments include diuretics. The current treatment provides moderate improvement. Compliance problems include diet.  Identifiable causes of hypertension include a thyroid problem.  Hyperlipidemia  This is a chronic problem. The current episode started more than 1 year ago. The problem is controlled. Recent lipid tests were reviewed and are normal. Exacerbating diseases include hypothyroidism. She has no history of diabetes or obesity. Pertinent negatives include no  chest pain or shortness of breath. Current antihyperlipidemic treatment includes statins. Risk factors for coronary artery disease include hypertension and dyslipidemia.  Thyroid Problem  Presents for follow-up (hypothyroidism) visit. Patient reports no cold intolerance, diarrhea, menstrual problem, palpitations or visual change. The symptoms have been improving. Her past medical history is significant for hyperlipidemia. There is no history of diabetes.      Review of Systems  Respiratory: Negative for shortness of breath.   Cardiovascular: Negative for chest pain and palpitations.  Gastrointestinal: Negative for diarrhea.  Endocrine: Negative for cold intolerance.  Genitourinary: Negative for menstrual problem.  Musculoskeletal: Negative for neck pain.  Neurological: Negative for headaches.  All other systems reviewed and are negative.      Objective:   Physical Exam  Constitutional: She is oriented to person, place, and time. She appears well-developed and well-nourished.  HENT:  Nose: Nose normal.  Mouth/Throat: Oropharynx is clear and moist.  Eyes: EOM are normal.  Neck: Trachea normal, normal range of motion and full passive range of motion without pain. Neck supple. No JVD present. Carotid bruit is not present. No thyromegaly present.  Cardiovascular: Normal rate, regular rhythm, normal heart sounds and intact distal pulses.  Exam reveals no gallop and no friction rub.   No murmur heard. Pulmonary/Chest: Effort normal and breath sounds normal.  Abdominal: Soft. Bowel sounds are normal. She exhibits no distension and no mass. There is no tenderness.  Musculoskeletal: Normal range of motion.  Lymphadenopathy:    She has no cervical adenopathy.  Neurological: She is alert and oriented to person,  place, and time. She has normal reflexes.  Skin: Skin is warm and dry.  Psychiatric: She has a normal mood and affect. Her behavior is normal. Judgment and thought content normal.    BP (!) 145/76   Pulse 81   Temp 97.6 F (36.4 C) (Oral)   Ht 5' 4"  (1.626 m)   Wt 172 lb (78 kg)   BMI 29.52 kg/m        Assessment & Plan:   1. Essential hypertension Low sodium diet - hydrochlorothiazide (HYDRODIURIL) 25 MG tablet; TAKE 1 TABLET (25 MG TOTAL) BY MOUTH DAILY.  Dispense: 90 tablet; Refill: 1 - CMP14+EGFR  2. Hypothyroidism, unspecified type - levothyroxine (SYNTHROID, LEVOTHROID) 75 MCG tablet; Take 1 tablet (75 mcg total) by mouth daily before breakfast.  Dispense: 90 tablet; Refill: 1 - Thyroid Panel With TSH  3. BMI 29.0-29.9,adult Discussed diet and exercise for person with BMI >25 Will recheck weight in 3-6 months  4. Hyperlipidemia with target LDL less than 100 Low fta diet - rosuvastatin (CRESTOR) 10 MG tablet; Take 1 tablet (10 mg total) by mouth daily.  Dispense: 90 tablet; Refill: 1 - Lipid panel    Labs pending Health maintenance reviewed Diet and exercise encouraged Continue all meds Follow up  In 6 months   [place], FNP

## 2016-04-02 NOTE — Patient Instructions (Signed)
DASH Eating Plan DASH stands for "Dietary Approaches to Stop Hypertension." The DASH eating plan is a healthy eating plan that has been shown to reduce high blood pressure (hypertension). It may also reduce your risk for type 2 diabetes, heart disease, and stroke. The DASH eating plan may also help with weight loss. What are tips for following this plan? General guidelines  Avoid eating more than 2,300 mg (milligrams) of salt (sodium) a day. If you have hypertension, you may need to reduce your sodium intake to 1,500 mg a day.  Limit alcohol intake to no more than 1 drink a day for nonpregnant women and 2 drinks a day for men. One drink equals 12 oz of beer, 5 oz of wine, or 1 oz of hard liquor.  Work with your health care provider to maintain a healthy body weight or to lose weight. Ask what an ideal weight is for you.  Get at least 30 minutes of exercise that causes your heart to beat faster (aerobic exercise) most days of the week. Activities may include walking, swimming, or biking.  Work with your health care provider or diet and nutrition specialist (dietitian) to adjust your eating plan to your individual calorie needs. Reading food labels  Check food labels for the amount of sodium per serving. Choose foods with less than 5 percent of the Daily Value of sodium. Generally, foods with less than 300 mg of sodium per serving fit into this eating plan.  To find whole grains, look for the word "whole" as the first word in the ingredient list. Shopping  Buy products labeled as "low-sodium" or "no salt added."  Buy fresh foods. Avoid canned foods and premade or frozen meals. Cooking  Avoid adding salt when cooking. Use salt-free seasonings or herbs instead of table salt or sea salt. Check with your health care provider or pharmacist before using salt substitutes.  Do not fry foods. Cook foods using healthy methods such as baking, boiling, grilling, and broiling instead.  Cook with  heart-healthy oils, such as olive, canola, soybean, or sunflower oil. Meal planning   Eat a balanced diet that includes: ? 5 or more servings of fruits and vegetables each day. At each meal, try to fill half of your plate with fruits and vegetables. ? Up to 6-8 servings of whole grains each day. ? Less than 6 oz of lean meat, poultry, or fish each day. A 3-oz serving of meat is about the same size as a deck of cards. One egg equals 1 oz. ? 2 servings of low-fat dairy each day. ? A serving of nuts, seeds, or beans 5 times each week. ? Heart-healthy fats. Healthy fats called Omega-3 fatty acids are found in foods such as flaxseeds and coldwater fish, like sardines, salmon, and mackerel.  Limit how much you eat of the following: ? Canned or prepackaged foods. ? Food that is high in trans fat, such as fried foods. ? Food that is high in saturated fat, such as fatty meat. ? Sweets, desserts, sugary drinks, and other foods with added sugar. ? Full-fat dairy products.  Do not salt foods before eating.  Try to eat at least 2 vegetarian meals each week.  Eat more home-cooked food and less restaurant, buffet, and fast food.  When eating at a restaurant, ask that your food be prepared with less salt or no salt, if possible. What foods are recommended? The items listed may not be a complete list. Talk with your dietitian about what   dietary choices are best for you. Grains Whole-grain or whole-wheat bread. Whole-grain or whole-wheat pasta. Brown rice. Oatmeal. Quinoa. Bulgur. Whole-grain and low-sodium cereals. Pita bread. Low-fat, low-sodium crackers. Whole-wheat flour tortillas. Vegetables Fresh or frozen vegetables (raw, steamed, roasted, or grilled). Low-sodium or reduced-sodium tomato and vegetable juice. Low-sodium or reduced-sodium tomato sauce and tomato paste. Low-sodium or reduced-sodium canned vegetables. Fruits All fresh, dried, or frozen fruit. Canned fruit in natural juice (without  added sugar). Meat and other protein foods Skinless chicken or turkey. Ground chicken or turkey. Pork with fat trimmed off. Fish and seafood. Egg whites. Dried beans, peas, or lentils. Unsalted nuts, nut butters, and seeds. Unsalted canned beans. Lean cuts of beef with fat trimmed off. Low-sodium, lean deli meat. Dairy Low-fat (1%) or fat-free (skim) milk. Fat-free, low-fat, or reduced-fat cheeses. Nonfat, low-sodium ricotta or cottage cheese. Low-fat or nonfat yogurt. Low-fat, low-sodium cheese. Fats and oils Soft margarine without trans fats. Vegetable oil. Low-fat, reduced-fat, or light mayonnaise and salad dressings (reduced-sodium). Canola, safflower, olive, soybean, and sunflower oils. Avocado. Seasoning and other foods Herbs. Spices. Seasoning mixes without salt. Unsalted popcorn and pretzels. Fat-free sweets. What foods are not recommended? The items listed may not be a complete list. Talk with your dietitian about what dietary choices are best for you. Grains Baked goods made with fat, such as croissants, muffins, or some breads. Dry pasta or rice meal packs. Vegetables Creamed or fried vegetables. Vegetables in a cheese sauce. Regular canned vegetables (not low-sodium or reduced-sodium). Regular canned tomato sauce and paste (not low-sodium or reduced-sodium). Regular tomato and vegetable juice (not low-sodium or reduced-sodium). Pickles. Olives. Fruits Canned fruit in a light or heavy syrup. Fried fruit. Fruit in cream or butter sauce. Meat and other protein foods Fatty cuts of meat. Ribs. Fried meat. Bacon. Sausage. Bologna and other processed lunch meats. Salami. Fatback. Hotdogs. Bratwurst. Salted nuts and seeds. Canned beans with added salt. Canned or smoked fish. Whole eggs or egg yolks. Chicken or turkey with skin. Dairy Whole or 2% milk, cream, and half-and-half. Whole or full-fat cream cheese. Whole-fat or sweetened yogurt. Full-fat cheese. Nondairy creamers. Whipped toppings.  Processed cheese and cheese spreads. Fats and oils Butter. Stick margarine. Lard. Shortening. Ghee. Bacon fat. Tropical oils, such as coconut, palm kernel, or palm oil. Seasoning and other foods Salted popcorn and pretzels. Onion salt, garlic salt, seasoned salt, table salt, and sea salt. Worcestershire sauce. Tartar sauce. Barbecue sauce. Teriyaki sauce. Soy sauce, including reduced-sodium. Steak sauce. Canned and packaged gravies. Fish sauce. Oyster sauce. Cocktail sauce. Horseradish that you find on the shelf. Ketchup. Mustard. Meat flavorings and tenderizers. Bouillon cubes. Hot sauce and Tabasco sauce. Premade or packaged marinades. Premade or packaged taco seasonings. Relishes. Regular salad dressings. Where to find more information:  National Heart, Lung, and Blood Institute: www.nhlbi.nih.gov  American Heart Association: www.heart.org Summary  The DASH eating plan is a healthy eating plan that has been shown to reduce high blood pressure (hypertension). It may also reduce your risk for type 2 diabetes, heart disease, and stroke.  With the DASH eating plan, you should limit salt (sodium) intake to 2,300 mg a day. If you have hypertension, you may need to reduce your sodium intake to 1,500 mg a day.  When on the DASH eating plan, aim to eat more fresh fruits and vegetables, whole grains, lean proteins, low-fat dairy, and heart-healthy fats.  Work with your health care provider or diet and nutrition specialist (dietitian) to adjust your eating plan to your individual   calorie needs. This information is not intended to replace advice given to you by your health care provider. Make sure you discuss any questions you have with your health care provider. Document Released: 12/28/2010 Document Revised: 01/02/2016 Document Reviewed: 01/02/2016 Elsevier Interactive Patient Education  2017 [employer]  

## 2016-04-03 ENCOUNTER — Other Ambulatory Visit: Payer: Self-pay | Admitting: Nurse Practitioner

## 2016-04-03 LAB — CMP14+EGFR
A/G RATIO: 1.6 (ref 1.2–2.2)
ALBUMIN: 4.4 g/dL (ref 3.5–5.5)
ALT: 21 IU/L (ref 0–32)
AST: 22 IU/L (ref 0–40)
Alkaline Phosphatase: 68 IU/L (ref 39–117)
BILIRUBIN TOTAL: 0.4 mg/dL (ref 0.0–1.2)
BUN / CREAT RATIO: 15 (ref 9–23)
BUN: 14 mg/dL (ref 6–24)
CALCIUM: 9.1 mg/dL (ref 8.7–10.2)
CHLORIDE: 104 mmol/L (ref 96–106)
CO2: 25 mmol/L (ref 18–29)
Creatinine, Ser: 0.91 mg/dL (ref 0.57–1.00)
GFR, EST AFRICAN AMERICAN: 80 mL/min/{1.73_m2} (ref >59)
GFR, EST NON AFRICAN AMERICAN: 69 mL/min/{1.73_m2} (ref >59)
Globulin, Total: 2.8 g/dL (ref 1.5–4.5)
Glucose: 106 mg/dL — ABNORMAL HIGH (ref 65–99)
POTASSIUM: 4.4 mmol/L (ref 3.5–5.2)
Sodium: 143 mmol/L (ref 134–144)
TOTAL PROTEIN: 7.2 g/dL (ref 6.0–8.5)

## 2016-04-03 LAB — THYROID PANEL WITH TSH
FREE THYROXINE INDEX: 2.3 (ref 1.2–4.9)
T3 Uptake Ratio: 29 % (ref 24–39)
T4 TOTAL: 8 ug/dL (ref 4.5–12.0)
TSH: 4.61 u[IU]/mL — AB (ref 0.450–4.500)

## 2016-04-03 LAB — LIPID PANEL
CHOL/HDL RATIO: 2.8 ratio (ref 0.0–4.4)
Cholesterol, Total: 125 mg/dL (ref 100–199)
HDL: 44 mg/dL (ref >39)
LDL Calculated: 58 mg/dL (ref 0–99)
Triglycerides: 115 mg/dL (ref 0–149)
VLDL CHOLESTEROL CAL: 23 mg/dL (ref 5–40)

## 2016-04-03 MED ORDER — LEVOTHYROXINE SODIUM 88 MCG PO TABS: 88.0000 ug | ORAL_TABLET | Freq: Every day | ORAL | 5 refills | Status: DC

## 2016-04-05 ENCOUNTER — Other Ambulatory Visit: Payer: Self-pay | Admitting: Nurse Practitioner

## 2016-04-05 DIAGNOSIS — E785 Hyperlipidemia, unspecified: Secondary | ICD-10-CM

## 2016-07-27 ENCOUNTER — Other Ambulatory Visit: Payer: Self-pay | Admitting: Nurse Practitioner

## 2016-08-01 ENCOUNTER — Other Ambulatory Visit: Payer: Self-pay | Admitting: Nurse Practitioner

## 2016-10-04 ENCOUNTER — Ambulatory Visit (INDEPENDENT_AMBULATORY_CARE_PROVIDER_SITE_OTHER): Payer: Managed Care, Other (non HMO) | Admitting: Nurse Practitioner

## 2016-10-04 ENCOUNTER — Encounter: Payer: Self-pay | Admitting: Nurse Practitioner

## 2016-10-04 VITALS — BP 141/74 | HR 75 | Temp 97.6°F | Ht 64.0 in | Wt 170.0 lb

## 2016-10-04 DIAGNOSIS — Z6829 Body mass index (BMI) 29.0-29.9, adult: Secondary | ICD-10-CM

## 2016-10-04 DIAGNOSIS — E039 Hypothyroidism, unspecified: Secondary | ICD-10-CM

## 2016-10-04 DIAGNOSIS — E785 Hyperlipidemia, unspecified: Secondary | ICD-10-CM | POA: Diagnosis not present

## 2016-10-04 DIAGNOSIS — I10 Essential (primary) hypertension: Secondary | ICD-10-CM | POA: Diagnosis not present

## 2016-10-04 MED ORDER — LEVOTHYROXINE SODIUM 88 MCG PO TABS: ORAL_TABLET | ORAL | 5 refills | Status: DC

## 2016-10-04 MED ORDER — LISINOPRIL 10 MG PO TABS: 10.0000 mg | ORAL_TABLET | Freq: Every day | ORAL | 3 refills | Status: DC

## 2016-10-04 MED ORDER — ROSUVASTATIN CALCIUM 10 MG PO TABS: 10.0000 mg | ORAL_TABLET | Freq: Every day | ORAL | 1 refills | Status: DC

## 2016-10-04 NOTE — Patient Instructions (Signed)

## 2016-10-04 NOTE — Progress Notes (Signed)
Subjective:    Patient ID: Teresa Ayala, female    DOB: October 20, 1955, 61 y.o.   MRN: [id number]  HPI Leianne Callins is here today for follow up of chronic medical problem.  Outpatient Encounter Prescriptions as of 10/04/2016  Medication Sig  . aspirin EC 81 MG tablet Take 81 mg by mouth daily.  . B Complex-C (SUPER B COMPLEX PO) Take 1 tablet by mouth daily.  . Coenzyme Q10 (CO Q 10) 100 MG CAPS Take 200 mg by mouth daily.  . Glucosamine-Chondroit-Vit C-Mn (GLUCOSAMINE CHONDR 1500 COMPLX PO) Take 1 capsule by mouth daily.  . hydrochlorothiazide (HYDRODIURIL) 25 MG tablet TAKE 1 TABLET (25 MG TOTAL) BY MOUTH DAILY.  Marland Kitchen levothyroxine (SYNTHROID, LEVOTHROID) 88 MCG tablet TAKE ONE TABLET BY MOUTH ONCE DAILY BEFORE BREAKFAST  . loratadine (CLARITIN) 10 MG tablet Take 10 mg by mouth daily.  . Lutein-Zeaxanthin 25-5 MG CAPS Take 1 tablet by mouth daily.  . Multiple Vitamins-Minerals (DAILY MULTIVITAMIN PO) Take 1 tablet by mouth daily.  . Omega-3 Fatty Acids (FISH OIL TRIPLE STRENGTH) 1400 MG CAPS Take 1 capsule by mouth daily.  . rosuvastatin (CRESTOR) 10 MG tablet Take 1 tablet (10 mg total) by mouth daily.  . rosuvastatin (CRESTOR) 10 MG tablet TAKE 1 TABLET (10 MG TOTAL) BY MOUTH DAILY.   No facility-administered encounter medications on file as of 10/04/2016.     1. Essential hypertension  Blood pressure was being controlled with HCTZ, but patient stopped taking this 6 months ago d/t pain in her feet.  Patient does not check blood pressure at home.  2. Hypothyroidism, unspecified type  Patient managed with levothyroxine.  Regular lab monitoring.  3. Hyperlipidemia with target LDL less than 100  Managed with rosuvastatin daily.  LFTs monitored regularly.  4. BMI 29.0-29.9,adult  No significant weight gain or loss.    New complaints: Interested in getting the shingles vaccine.  Social history:    Review of Systems  Constitutional: Negative for activity change, appetite change, fatigue and  unexpected weight change.  Respiratory: Negative for cough and shortness of breath.   Cardiovascular: Negative for chest pain and palpitations.  Endocrine: Negative for cold intolerance.  Musculoskeletal: Negative for arthralgias and myalgias.  Neurological: Negative for dizziness, light-headedness and headaches.  All other systems reviewed and are negative.      Objective:   Physical Exam  Constitutional: She is oriented to person, place, and time. She appears well-developed and well-nourished. No distress.  HENT:  Head: Normocephalic.  Right Ear: External ear normal.  Left Ear: External ear normal.  Mouth/Throat: Oropharynx is clear and moist.  Eyes: Pupils are equal, round, and reactive to light.  Neck: Normal range of motion. Neck supple. No thyromegaly present.  Cardiovascular: Normal rate, regular rhythm, normal heart sounds and intact distal pulses.   No murmur heard. Pulmonary/Chest: Effort normal and breath sounds normal. No respiratory distress. She has no wheezes.  Abdominal: Soft. Bowel sounds are normal. She exhibits no distension. There is no tenderness.  Musculoskeletal: Normal range of motion. She exhibits no edema.  Lymphadenopathy:    She has no cervical adenopathy.  Neurological: She is alert and oriented to person, place, and time.  Skin: Skin is warm and dry.  Psychiatric: She has a normal mood and affect. Her behavior is normal.   BP (!) 141/74   Pulse 75   Temp 97.6 F (36.4 C) (Oral)   Ht _0  (1.626 m)   Wt 170 lb (77.1 kg)  BMI 29.18 kg/m     Assessment & Plan:  1. Essential hypertension Low sodium diet - lisinopril (PRINIVIL,ZESTRIL) 10 MG tablet; Take 1 tablet (10 mg total) by mouth daily.  Dispense: 90 tablet; Refill: 3 - CMP14+EGFR  2. Hypothyroidism, unspecified type - levothyroxine (SYNTHROID, LEVOTHROID) 88 MCG tablet; TAKE ONE TABLET BY MOUTH ONCE DAILY BEFORE BREAKFAST  Dispense: 30 tablet; Refill: 5 - Thyroid Panel With TSH  3.  Hyperlipidemia with target LDL less than 100 Low fat diet - rosuvastatin (CRESTOR) 10 MG tablet; Take 1 tablet (10 mg total) by mouth daily.  Dispense: 90 tablet; Refill: 1 - Lipid panel  4. BMI 29.0-29.9,adult Discussed diet and exercise for person with BMI >25 Will recheck weight in 3-6 months    Labs pending Health maintenance reviewed Diet and exercise encouraged Continue all meds Follow up  In 6 month   [place], FNP

## 2016-10-05 LAB — CMP14+EGFR
A/G RATIO: 1.8 (ref 1.2–2.2)
ALT: 15 IU/L (ref 0–32)
AST: 21 IU/L (ref 0–40)
Albumin: 4.3 g/dL (ref 3.6–4.8)
Alkaline Phosphatase: 88 IU/L (ref 39–117)
BUN/Creatinine Ratio: 14 (ref 12–28)
BUN: 14 mg/dL (ref 8–27)
Bilirubin Total: 0.4 mg/dL (ref 0.0–1.2)
CALCIUM: 9.1 mg/dL (ref 8.7–10.3)
CO2: 23 mmol/L (ref 20–29)
Chloride: 103 mmol/L (ref 96–106)
Creatinine, Ser: 0.98 mg/dL (ref 0.57–1.00)
GFR, EST AFRICAN AMERICAN: 73 mL/min/{1.73_m2} (ref >59)
GFR, EST NON AFRICAN AMERICAN: 63 mL/min/{1.73_m2} (ref >59)
GLOBULIN, TOTAL: 2.4 g/dL (ref 1.5–4.5)
Glucose: 107 mg/dL — ABNORMAL HIGH (ref 65–99)
POTASSIUM: 4.4 mmol/L (ref 3.5–5.2)
SODIUM: 139 mmol/L (ref 134–144)
TOTAL PROTEIN: 6.7 g/dL (ref 6.0–8.5)

## 2016-10-05 LAB — LIPID PANEL
CHOL/HDL RATIO: 2.3 ratio (ref 0.0–4.4)
Cholesterol, Total: 105 mg/dL (ref 100–199)
HDL: 46 mg/dL (ref >39)
LDL Calculated: 42 mg/dL (ref 0–99)
Triglycerides: 85 mg/dL (ref 0–149)
VLDL Cholesterol Cal: 17 mg/dL (ref 5–40)

## 2016-10-05 LAB — THYROID PANEL WITH TSH
FREE THYROXINE INDEX: 2.3 (ref 1.2–4.9)
T3 UPTAKE RATIO: 29 % (ref 24–39)
T4, Total: 7.8 ug/dL (ref 4.5–12.0)
TSH: 2.31 u[IU]/mL (ref 0.450–4.500)

## 2017-01-29 ENCOUNTER — Other Ambulatory Visit: Payer: Self-pay | Admitting: *Deleted

## 2017-01-29 DIAGNOSIS — E039 Hypothyroidism, unspecified: Secondary | ICD-10-CM

## 2017-01-29 MED ORDER — LEVOTHYROXINE SODIUM 88 MCG PO TABS: ORAL_TABLET | ORAL | 2 refills | Status: DC

## 2017-04-04 ENCOUNTER — Ambulatory Visit: Payer: Managed Care, Other (non HMO) | Admitting: Nurse Practitioner

## 2017-04-04 ENCOUNTER — Encounter: Payer: Self-pay | Admitting: Nurse Practitioner

## 2017-04-04 VITALS — BP 135/79 | HR 75 | Temp 97.0°F | Ht 64.0 in | Wt 170.0 lb

## 2017-04-04 DIAGNOSIS — E039 Hypothyroidism, unspecified: Secondary | ICD-10-CM

## 2017-04-04 DIAGNOSIS — Z6829 Body mass index (BMI) 29.0-29.9, adult: Secondary | ICD-10-CM

## 2017-04-04 DIAGNOSIS — E785 Hyperlipidemia, unspecified: Secondary | ICD-10-CM

## 2017-04-04 DIAGNOSIS — I10 Essential (primary) hypertension: Secondary | ICD-10-CM

## 2017-04-04 MED ORDER — ROSUVASTATIN CALCIUM 10 MG PO TABS
10.0000 mg | ORAL_TABLET | Freq: Every day | ORAL | 1 refills | Status: DC
Start: 2017-04-04 — End: 2018-04-08

## 2017-04-04 NOTE — Progress Notes (Signed)
Subjective:    Patient ID: Teresa Ayala, female    DOB: 1955/09/07, 62 y.o.   MRN: [id number]  HPI  Teresa Ayala is here today for follow up of chronic medical problem.  Outpatient Encounter Medications as of 04/04/2017  Medication Sig  . aspirin EC 81 MG tablet Take 81 mg by mouth daily.  . B Complex-C (SUPER B COMPLEX PO) Take 1 tablet by mouth daily.  . Coenzyme Q10 (CO Q 10) 100 MG CAPS Take 200 mg by mouth daily.  . Glucosamine-Chondroit-Vit C-Mn (GLUCOSAMINE CHONDR 1500 COMPLX PO) Take 1 capsule by mouth daily.  Marland Kitchen levothyroxine (SYNTHROID, LEVOTHROID) 88 MCG tablet TAKE ONE TABLET BY MOUTH ONCE DAILY BEFORE BREAKFAST  . lisinopril (PRINIVIL,ZESTRIL) 10 MG tablet Take 1 tablet (10 mg total) by mouth daily.  Marland Kitchen loratadine (CLARITIN) 10 MG tablet Take 10 mg by mouth daily.  . Lutein-Zeaxanthin 25-5 MG CAPS Take 1 tablet by mouth daily.  . Multiple Vitamins-Minerals (DAILY MULTIVITAMIN PO) Take 1 tablet by mouth daily.  . Omega-3 Fatty Acids (FISH OIL TRIPLE STRENGTH) 1400 MG CAPS Take 1 capsule by mouth daily.  . rosuvastatin (CRESTOR) 10 MG tablet Take 1 tablet (10 mg total) by mouth daily.     1. Essential hypertension  No c/o chest pain, sob or headache. Does not check blood pressure at home. BP Readings from Last 3 Encounters:  04/04/17 135/79  10/04/16 (!) 141/74  04/02/16 136/82     2. Hypothyroidism, unspecified type  No problems that she is aware of  3. BMI 29.0-29.9,adult  No recent changes in weight  4. Hyperlipidemia with target LDL less than 100  Does not really watch diet very closely.    New complaints: None today  Social history: Mom lives alone so she has to check on her frequently    Review of Systems  Constitutional: Negative for activity change and appetite change.  HENT: Negative.   Eyes: Negative for pain.  Respiratory: Negative for shortness of breath.   Cardiovascular: Negative for chest pain, palpitations and leg swelling.  Gastrointestinal:  Negative for abdominal pain.  Endocrine: Negative for polydipsia.  Genitourinary: Negative.   Skin: Negative for rash.  Neurological: Negative for dizziness, weakness and headaches.  Hematological: Does not bruise/bleed easily.  Psychiatric/Behavioral: Negative.   All other systems reviewed and are negative.      Objective:   Physical Exam  Constitutional: She is oriented to person, place, and time. She appears well-developed and well-nourished.  HENT:  Nose: Nose normal.  Mouth/Throat: Oropharynx is clear and moist.  Eyes: EOM are normal.  Neck: Trachea normal, normal range of motion and full passive range of motion without pain. Neck supple. No JVD present. Carotid bruit is not present. No thyromegaly present.  Cardiovascular: Normal rate, regular rhythm, normal heart sounds and intact distal pulses. Exam reveals no gallop and no friction rub.  No murmur heard. Pulmonary/Chest: Effort normal and breath sounds normal.  Abdominal: Soft. Bowel sounds are normal. She exhibits no distension and no mass. There is no tenderness.  Musculoskeletal: Normal range of motion.  Lymphadenopathy:    She has no cervical adenopathy.  Neurological: She is alert and oriented to person, place, and time. She has normal reflexes.  Skin: Skin is warm and dry.  Psychiatric: She has a normal mood and affect. Her behavior is normal. Judgment and thought content normal.   BP 135/79   Pulse 75   Temp (!) 97 F (36.1 C) (Oral)   Ht 5'  4" (1.626 m)   Wt 170 lb (77.1 kg)   BMI 29.18 kg/m       Assessment & Plan:  1. Essential hypertension Low sodium diet - CMP14+EGFR  2. Hypothyroidism, unspecified type - Thyroid Panel With TSH  3. BMI 29.0-29.9,adult Discussed diet and exercise for person with BMI >25 Will recheck weight in 3-6 months  4. Hyperlipidemia with target LDL less than 100 Low fat diet - rosuvastatin (CRESTOR) 10 MG tablet; Take 1 tablet (10 mg total) by mouth daily.  Dispense: 90  tablet; Refill: 1 - Lipid panel    Labs pending Health maintenance reviewed Diet and exercise encouraged Continue all meds Follow up  In 6 months   [place], FNP

## 2017-04-04 NOTE — Patient Instructions (Signed)
Fat and Cholesterol Restricted Diet High levels of fat and cholesterol in your blood may lead to various health problems, such as diseases of the heart, blood vessels, gallbladder, liver, and pancreas. Fats are concentrated sources of energy that come in various forms. Certain types of fat, including saturated fat, may be harmful in excess. Cholesterol is a substance needed by your body in small amounts. Your body makes all the cholesterol it needs. Excess cholesterol comes from the food you eat. When you have high levels of cholesterol and saturated fat in your blood, health problems can develop because the excess fat and cholesterol will gather along the walls of your blood vessels, causing them to narrow. Choosing the right foods will help you control your intake of fat and cholesterol. This will help keep the levels of these substances in your blood within normal limits and reduce your risk of disease. What is my plan? Your health care provider recommends that you:  Limit your fat intake to ______% or less of your total calories per day.  Limit the amount of cholesterol in your diet to less than _________mg per day.  Eat 20-30 grams of fiber each day.  What types of fat should I choose?  Choose healthy fats more often. Choose monounsaturated and polyunsaturated fats, such as olive and canola oil, flaxseeds, walnuts, almonds, and seeds.  Eat more omega-3 fats. Good choices include salmon, mackerel, sardines, tuna, flaxseed oil, and ground flaxseeds. Aim to eat fish at least two times a week.  Limit saturated fats. Saturated fats are primarily found in animal products, such as meats, butter, and cream. Plant sources of saturated fats include palm oil, palm kernel oil, and coconut oil.  Avoid foods with partially hydrogenated oils in them. These contain trans fats. Examples of foods that contain trans fats are stick margarine, some tub margarines, cookies, crackers, and other baked goods. What  general guidelines do I need to follow? These guidelines for healthy eating will help you control your intake of fat and cholesterol:  Check food labels carefully to identify foods with trans fats or high amounts of saturated fat.  Fill one half of your plate with vegetables and green salads.  Fill one fourth of your plate with whole grains. Look for the word "whole" as the first word in the ingredient list.  Fill one fourth of your plate with lean protein foods.  Limit fruit to two servings a day. Choose fruit instead of juice.  Eat more foods that contain fiber, such as apples, broccoli, carrots, beans, peas, and barley.  Eat more home-cooked food and less restaurant, buffet, and fast food.  Limit or avoid alcohol.  Limit foods high in starch and sugar.  Limit fried foods.  Cook foods using methods other than frying. Baking, boiling, grilling, and broiling are all great options.  Lose weight if you are overweight. Losing just 5-10% of your initial body weight can help your overall health and prevent diseases such as diabetes and heart disease.  What foods can I eat? Grains  Whole grains, such as whole wheat or whole grain breads, crackers, cereals, and pasta. Unsweetened oatmeal, bulgur, barley, quinoa, or brown rice. Corn or whole wheat flour tortillas. Vegetables  Fresh or frozen vegetables (raw, steamed, roasted, or grilled). Green salads. Fruits  All fresh, canned (in natural juice), or frozen fruits. Meats and other protein foods  Ground beef (85% or leaner), grass-fed beef, or beef trimmed of fat. Skinless chicken or turkey. Ground chicken or turkey.   Pork trimmed of fat. All fish and seafood. Eggs. Dried beans, peas, or lentils. Unsalted nuts or seeds. Unsalted canned or dry beans. Dairy  Low-fat dairy products, such as skim or 1% milk, 2% or reduced-fat cheeses, low-fat ricotta or cottage cheese, or plain low-fat yo Fats and oils  Tub margarines without trans  fats. Light or reduced-fat mayonnaise and salad dressings. Avocado. Olive, canola, sesame, or safflower oils. Natural peanut or almond butter (choose ones without added sugar and oil). The items listed above may not be a complete list of recommended foods or beverages. Contact your dietitian for more options. Foods to avoid Grains  White bread. White pasta. White rice. Cornbread. Bagels, pastries, and croissants. Crackers that contain trans fat. Vegetables  White potatoes. Corn. Creamed or fried vegetables. Vegetables in a cheese sauce. Fruits  Dried fruits. Canned fruit in light or heavy syrup. Fruit juice. Meats and other protein foods  Fatty cuts of meat. Ribs, chicken wings, bacon, sausage, bologna, salami, chitterlings, fatback, hot dogs, bratwurst, and packaged luncheon meats. Liver and organ meats. Dairy  Whole or 2% milk, cream, half-and-half, and cream cheese. Whole milk cheeses. Whole-fat or sweetened yogurt. Full-fat cheeses. Nondairy creamers and whipped toppings. Processed cheese, cheese spreads, or cheese curds. Beverages  Alcohol. Sweetened drinks (such as sodas, lemonade, and fruit drinks or punches). Fats and oils  Butter, stick margarine, lard, shortening, ghee, or bacon fat. Coconut, palm kernel, or palm oils. Sweets and desserts  Corn syrup, sugars, honey, and molasses. Candy. Jam and jelly. Syrup. Sweetened cereals. Cookies, pies, cakes, donuts, muffins, and ice cream. The items listed above may not be a complete list of foods and beverages to avoid. Contact your dietitian for more information. This information is not intended to replace advice given to you by your health care provider. Make sure you discuss any questions you have with your health care provider. Document Released: 01/08/2005 Document Revised: 01/29/2014 Document Reviewed: 04/08/2013 Elsevier Interactive Patient Education  2018 [employer]  

## 2017-04-05 LAB — LIPID PANEL
CHOL/HDL RATIO: 3 ratio (ref 0.0–4.4)
Cholesterol, Total: 139 mg/dL (ref 100–199)
HDL: 46 mg/dL (ref >39)
LDL Calculated: 71 mg/dL (ref 0–99)
Triglycerides: 110 mg/dL (ref 0–149)
VLDL Cholesterol Cal: 22 mg/dL (ref 5–40)

## 2017-04-05 LAB — CMP14+EGFR
A/G RATIO: 1.5 (ref 1.2–2.2)
ALBUMIN: 4.3 g/dL (ref 3.6–4.8)
ALK PHOS: 81 IU/L (ref 39–117)
ALT: 19 IU/L (ref 0–32)
AST: 23 IU/L (ref 0–40)
BILIRUBIN TOTAL: 0.4 mg/dL (ref 0.0–1.2)
BUN / CREAT RATIO: 14 (ref 12–28)
BUN: 15 mg/dL (ref 8–27)
CHLORIDE: 104 mmol/L (ref 96–106)
CO2: 25 mmol/L (ref 20–29)
Calcium: 9.2 mg/dL (ref 8.7–10.3)
Creatinine, Ser: 1.05 mg/dL — ABNORMAL HIGH (ref 0.57–1.00)
GFR calc Af Amer: 67 mL/min/{1.73_m2} (ref >59)
GFR calc non Af Amer: 58 mL/min/{1.73_m2} — ABNORMAL LOW (ref >59)
Globulin, Total: 2.8 g/dL (ref 1.5–4.5)
Glucose: 103 mg/dL — ABNORMAL HIGH (ref 65–99)
POTASSIUM: 4.5 mmol/L (ref 3.5–5.2)
SODIUM: 143 mmol/L (ref 134–144)
Total Protein: 7.1 g/dL (ref 6.0–8.5)

## 2017-04-05 LAB — THYROID PANEL WITH TSH
Free Thyroxine Index: 2.3 (ref 1.2–4.9)
T3 Uptake Ratio: 27 % (ref 24–39)
T4, Total: 8.5 ug/dL (ref 4.5–12.0)
TSH: 4.77 u[IU]/mL — ABNORMAL HIGH (ref 0.450–4.500)

## 2017-04-08 ENCOUNTER — Telehealth: Payer: Self-pay | Admitting: Nurse Practitioner

## 2017-04-08 ENCOUNTER — Other Ambulatory Visit: Payer: Self-pay | Admitting: Nurse Practitioner

## 2017-04-08 NOTE — Telephone Encounter (Signed)
Erroneous

## 2017-06-10 ENCOUNTER — Encounter: Payer: Self-pay | Admitting: *Deleted

## 2017-10-04 ENCOUNTER — Other Ambulatory Visit: Payer: Self-pay | Admitting: Nurse Practitioner

## 2017-10-04 DIAGNOSIS — I10 Essential (primary) hypertension: Secondary | ICD-10-CM

## 2017-10-04 NOTE — Telephone Encounter (Signed)
Last seen 04/04/17

## 2017-10-07 ENCOUNTER — Ambulatory Visit: Payer: Managed Care, Other (non HMO) | Admitting: Nurse Practitioner

## 2017-10-08 ENCOUNTER — Ambulatory Visit: Payer: Managed Care, Other (non HMO) | Admitting: Nurse Practitioner

## 2017-10-08 ENCOUNTER — Encounter: Payer: Self-pay | Admitting: Nurse Practitioner

## 2017-10-08 VITALS — BP 121/76 | HR 75 | Temp 96.8°F | Ht 64.0 in | Wt 145.0 lb

## 2017-10-08 DIAGNOSIS — Z1211 Encounter for screening for malignant neoplasm of colon: Secondary | ICD-10-CM

## 2017-10-08 DIAGNOSIS — E785 Hyperlipidemia, unspecified: Secondary | ICD-10-CM

## 2017-10-08 DIAGNOSIS — I10 Essential (primary) hypertension: Secondary | ICD-10-CM | POA: Diagnosis not present

## 2017-10-08 DIAGNOSIS — Z6829 Body mass index (BMI) 29.0-29.9, adult: Secondary | ICD-10-CM | POA: Diagnosis not present

## 2017-10-08 DIAGNOSIS — Z1212 Encounter for screening for malignant neoplasm of rectum: Secondary | ICD-10-CM

## 2017-10-08 DIAGNOSIS — E039 Hypothyroidism, unspecified: Secondary | ICD-10-CM

## 2017-10-08 NOTE — Patient Instructions (Signed)

## 2017-10-08 NOTE — Addendum Note (Signed)
Addended by: Bennie PieriniMARTIN, MARY-MARGARET on: 10/08/2017 09:03 AM   Modules accepted: Orders

## 2017-10-08 NOTE — Progress Notes (Signed)
Subjective:    Patient ID: Teresa Ayala, female    DOB: Feb 08, 1955, 62 y.o.   MRN: [id number]   Chief Complaint: Medical Management of Chronic Issues   HPI:  1. Essential hypertension  -checks BP once a week -no problems with lisinopril  2. Hypothyroidism, unspecified type  -no issues -still takes levothyroxine  3. Hyperlipidemia with target LDL less than 100 -recent weight loss, 25 lbs  -still takes Crestor with no issues   4. BMI 29.0-29.9,adult  -recent weight loss due to stress of husband having massive CVA    Outpatient Encounter Medications as of 10/08/2017  Medication Sig  . aspirin EC 81 MG tablet Take 81 mg by mouth daily.  . B Complex-C (SUPER B COMPLEX PO) Take 1 tablet by mouth daily.  . Coenzyme Q10 (CO Q 10) 100 MG CAPS Take 200 mg by mouth daily.  . Glucosamine-Chondroit-Vit C-Mn (GLUCOSAMINE CHONDR 1500 COMPLX PO) Take 1 capsule by mouth daily.  Marland Kitchen levothyroxine (SYNTHROID, LEVOTHROID) 88 MCG tablet TAKE ONE TABLET BY MOUTH ONCE DAILY BEFORE BREAKFAST  . lisinopril (PRINIVIL,ZESTRIL) 10 MG tablet TAKE 1 TABLET BY MOUTH EVERY DAY  . loratadine (CLARITIN) 10 MG tablet Take 10 mg by mouth daily.  . Lutein-Zeaxanthin 25-5 MG CAPS Take 1 tablet by mouth daily.  . Multiple Vitamins-Minerals (DAILY MULTIVITAMIN PO) Take 1 tablet by mouth daily.  . Omega-3 Fatty Acids (FISH OIL TRIPLE STRENGTH) 1400 MG CAPS Take 1 capsule by mouth daily.  . rosuvastatin (CRESTOR) 10 MG tablet Take 1 tablet (10 mg total) by mouth daily.   No facility-administered encounter medications on file as of 10/08/2017.       New complaints: No new complaints at this time  Social history: Married, 2 children, works in Audiological scientist. Husband had stroke  Review of Systems  Constitutional: Negative for activity change, chills, diaphoresis and fatigue.  HENT: Negative for congestion, ear discharge and ear pain.   Eyes: Negative for discharge and itching.  Respiratory: Negative for cough, chest  tightness and shortness of breath.   Cardiovascular: Negative for chest pain, palpitations and leg swelling.  Gastrointestinal: Negative for abdominal pain, constipation and diarrhea.  Endocrine: Negative for cold intolerance and heat intolerance.  Genitourinary: Negative for dysuria and frequency.  Musculoskeletal: Negative for arthralgias and back pain.  Skin: Negative for color change and rash.  Allergic/Immunologic: Negative for environmental allergies.  Neurological: Negative for dizziness and headaches.  Hematological: Does not bruise/bleed easily.  Psychiatric/Behavioral: Negative for agitation and behavioral problems.       Objective:   Physical Exam  Constitutional: Teresa Ayala is oriented to person, place, and time. Teresa Ayala appears well-developed and well-nourished.  HENT:  Head: Normocephalic and atraumatic.  Right Ear: External ear normal.  Left Ear: External ear normal.  Nose: Nose normal.  Mouth/Throat: Oropharynx is clear and moist.  Eyes: Pupils are equal, round, and reactive to light. EOM are normal.  Neck: Normal range of motion. Neck supple. No thyromegaly present.  Cardiovascular: Normal rate and regular rhythm.  Pulmonary/Chest: Effort normal and breath sounds normal.  Abdominal: Soft. Bowel sounds are normal.  Musculoskeletal: Normal range of motion.  Neurological: Teresa Ayala is alert and oriented to person, place, and time. No cranial nerve deficit.  Skin: Skin is warm and dry.  Psychiatric: Teresa Ayala has a normal mood and affect. Her behavior is normal. Judgment and thought content normal.       BP 121/76   Pulse 75   Temp (!) 96.8 F (36 C) (Oral)  Ht 5\' 4"  (1.626 m)   Wt 145 lb (65.8 kg)   BMI 24.89 kg/m   Assessment & Plan:  Teresa Ayala comes in today with chief complaint of Medical Management of Chronic Issues   Diagnosis and orders addressed:  1. Essential hypertension -continue lisinopril -CMET  2. Hypothyroidism, unspecified type -continue  levothyroxine -thyroid panel with TSH -  3. Hyperlipidemia with target LDL less than 100 -avoid fatty foods -continue Crestor -lipid panel  4. BMI 29.0-29.9,adult -continue dietary & exercise modifications to maintain weight   Labs pending Health Maintenance reviewed Diet and exercise encouraged  Follow up plan: 6 months   Mary-Margaret Daphine DeutscherMartin, FNP

## 2017-10-09 LAB — CMP14+EGFR
A/G RATIO: 1.8 (ref 1.2–2.2)
ALT: 12 IU/L (ref 0–32)
AST: 21 IU/L (ref 0–40)
Albumin: 4.4 g/dL (ref 3.6–4.8)
Alkaline Phosphatase: 79 IU/L (ref 39–117)
BILIRUBIN TOTAL: 0.5 mg/dL (ref 0.0–1.2)
BUN/Creatinine Ratio: 16 (ref 12–28)
BUN: 14 mg/dL (ref 8–27)
CHLORIDE: 102 mmol/L (ref 96–106)
CO2: 20 mmol/L (ref 20–29)
Calcium: 9.2 mg/dL (ref 8.7–10.3)
Creatinine, Ser: 0.85 mg/dL (ref 0.57–1.00)
GFR calc Af Amer: 86 mL/min/{1.73_m2} (ref >59)
GFR calc non Af Amer: 74 mL/min/{1.73_m2} (ref >59)
GLUCOSE: 100 mg/dL — AB (ref 65–99)
Globulin, Total: 2.4 g/dL (ref 1.5–4.5)
Potassium: 4.5 mmol/L (ref 3.5–5.2)
Sodium: 144 mmol/L (ref 134–144)
TOTAL PROTEIN: 6.8 g/dL (ref 6.0–8.5)

## 2017-10-09 LAB — LIPID PANEL
Chol/HDL Ratio: 2.6 ratio (ref 0.0–4.4)
Cholesterol, Total: 131 mg/dL (ref 100–199)
HDL: 51 mg/dL (ref >39)
LDL CALC: 61 mg/dL (ref 0–99)
Triglycerides: 96 mg/dL (ref 0–149)
VLDL CHOLESTEROL CAL: 19 mg/dL (ref 5–40)

## 2017-10-09 LAB — THYROID PANEL WITH TSH
Free Thyroxine Index: 3.1 (ref 1.2–4.9)
T3 Uptake Ratio: 33 % (ref 24–39)
T4, Total: 9.5 ug/dL (ref 4.5–12.0)
TSH: 0.177 u[IU]/mL — AB (ref 0.450–4.500)

## 2017-10-11 ENCOUNTER — Other Ambulatory Visit: Payer: Self-pay | Admitting: Nurse Practitioner

## 2017-10-11 MED ORDER — LEVOTHYROXINE SODIUM 75 MCG PO TABS: 75.0000 ug | ORAL_TABLET | Freq: Every day | ORAL | 11 refills | Status: DC

## 2018-01-02 ENCOUNTER — Other Ambulatory Visit: Payer: Self-pay | Admitting: Nurse Practitioner

## 2018-01-02 DIAGNOSIS — I10 Essential (primary) hypertension: Secondary | ICD-10-CM

## 2018-03-19 DIAGNOSIS — I10 Essential (primary) hypertension: Secondary | ICD-10-CM

## 2018-03-19 MED ORDER — LISINOPRIL 10 MG PO TABS: 10.0000 mg | ORAL_TABLET | Freq: Every day | ORAL | 0 refills | Status: DC

## 2018-04-08 ENCOUNTER — Encounter: Payer: Self-pay | Admitting: Nurse Practitioner

## 2018-04-08 ENCOUNTER — Other Ambulatory Visit: Payer: Self-pay

## 2018-04-08 ENCOUNTER — Ambulatory Visit: Payer: Managed Care, Other (non HMO) | Admitting: Nurse Practitioner

## 2018-04-08 VITALS — BP 128/81 | HR 84 | Temp 97.0°F | Ht 64.0 in | Wt 146.0 lb

## 2018-04-08 DIAGNOSIS — E039 Hypothyroidism, unspecified: Secondary | ICD-10-CM | POA: Diagnosis not present

## 2018-04-08 DIAGNOSIS — E785 Hyperlipidemia, unspecified: Secondary | ICD-10-CM | POA: Diagnosis not present

## 2018-04-08 DIAGNOSIS — Z6829 Body mass index (BMI) 29.0-29.9, adult: Secondary | ICD-10-CM | POA: Diagnosis not present

## 2018-04-08 DIAGNOSIS — I10 Essential (primary) hypertension: Secondary | ICD-10-CM | POA: Diagnosis not present

## 2018-04-08 MED ORDER — LISINOPRIL 10 MG PO TABS: 10.0000 mg | ORAL_TABLET | Freq: Every day | ORAL | 0 refills | Status: DC

## 2018-04-08 MED ORDER — LEVOTHYROXINE SODIUM 75 MCG PO TABS: 75.0000 ug | ORAL_TABLET | Freq: Every day | ORAL | 11 refills | Status: DC

## 2018-04-08 MED ORDER — ROSUVASTATIN CALCIUM 10 MG PO TABS: 10.0000 mg | ORAL_TABLET | Freq: Every day | ORAL | 1 refills | Status: DC

## 2018-04-08 NOTE — Progress Notes (Signed)
Subjective:    Patient ID: Teresa Ayala, female    DOB: [birth year], 63 y.o.   MRN: [id number]   Chief Complaint: medical management of chronic issues  HPI:  1. Essential hypertension  No c/o chest pain, sob or headaches. Does not check blood pressure at home. BP Readings from Last 3 Encounters:  10/08/17 121/76  04/04/17 135/79  10/04/16 (!) 141/74     2. Hyperlipidemia with target LDL less than 100  Has been watching diet and little exercise.  3. Hypothyroidism, unspecified type  No problems that she is aware of.  4. BMI 29.0-29.9,adult  No recent weight changes    Outpatient Encounter Medications as of 04/08/2018  Medication Sig  . aspirin EC 81 MG tablet Take 81 mg by mouth daily.  . B Complex-C (SUPER B COMPLEX PO) Take 1 tablet by mouth daily.  . Coenzyme Q10 (CO Q 10) 100 MG CAPS Take 200 mg by mouth daily.  . Glucosamine-Chondroit-Vit C-Mn (GLUCOSAMINE CHONDR 1500 COMPLX PO) Take 1 capsule by mouth daily.  Marland Kitchen levothyroxine (SYNTHROID) 75 MCG tablet Take 1 tablet (75 mcg total) by mouth daily.  Marland Kitchen lisinopril (PRINIVIL,ZESTRIL) 10 MG tablet Take 1 tablet (10 mg total) by mouth daily.  Marland Kitchen loratadine (CLARITIN) 10 MG tablet Take 10 mg by mouth daily.  . Lutein-Zeaxanthin 25-5 MG CAPS Take 1 tablet by mouth daily.  . Multiple Vitamins-Minerals (DAILY MULTIVITAMIN PO) Take 1 tablet by mouth daily.  . Omega-3 Fatty Acids (FISH OIL TRIPLE STRENGTH) 1400 MG CAPS Take 1 capsule by mouth daily.  . rosuvastatin (CRESTOR) 10 MG tablet Take 1 tablet (10 mg total) by mouth daily.     New complaints: None today  Social history: Husband had a stroke and is doing some better. He is home and is able to do his on ADL's.   Review of Systems  Constitutional: Negative for activity change and appetite change.  HENT: Negative.   Eyes: Negative for pain.  Respiratory: Negative for shortness of breath.   Cardiovascular: Negative for chest pain, palpitations and leg swelling.   Gastrointestinal: Negative for abdominal pain.  Endocrine: Negative for polydipsia.  Genitourinary: Negative.   Skin: Negative for rash.  Neurological: Negative for dizziness, weakness and headaches.  Hematological: Does not bruise/bleed easily.  Psychiatric/Behavioral: Negative.   All other systems reviewed and are negative.      Objective:   Physical Exam Vitals signs and nursing note reviewed.  Constitutional:      General: She is not in acute distress.    Appearance: Normal appearance. She is well-developed.  HENT:     Head: Normocephalic.     Nose: Nose normal.  Eyes:     Pupils: Pupils are equal, round, and reactive to light.  Neck:     Musculoskeletal: Normal range of motion and neck supple.     Vascular: No carotid bruit or JVD.  Cardiovascular:     Rate and Rhythm: Normal rate and regular rhythm.     Heart sounds: Normal heart sounds.  Pulmonary:     Effort: Pulmonary effort is normal. No respiratory distress.     Breath sounds: Normal breath sounds. No wheezing or rales.  Chest:     Chest wall: No tenderness.  Abdominal:     General: Bowel sounds are normal. There is no distension or abdominal bruit.     Palpations: Abdomen is soft. There is no hepatomegaly, splenomegaly, mass or pulsatile mass.     Tenderness: There is no abdominal tenderness.  Musculoskeletal: Normal range of motion.  Lymphadenopathy:     Cervical: No cervical adenopathy.  Skin:    General: Skin is warm and dry.  Neurological:     Mental Status: She is alert and oriented to person, place, and time.     Deep Tendon Reflexes: Reflexes are normal and symmetric.  Psychiatric:        Behavior: Behavior normal.        Thought Content: Thought content normal.        Judgment: Judgment normal.    BP 128/81   Pulse 84   Temp (!) 97 F (36.1 C) (Oral)   Ht _0  (1.626 m)   Wt 146 lb (66.2 kg)   BMI 25.06 kg/m        Assessment & Plan:  Teresa Ayala comes in today with chief complaint  of Medical Management of Chronic Issues   Diagnosis and orders addressed:  1. Essential hypertension Low sodium diet - lisinopril (PRINIVIL,ZESTRIL) 10 MG tablet; Take 1 tablet (10 mg total) by mouth daily.  Dispense: 90 tablet; Refill: 0 - CMP14+EGFR  2. Hyperlipidemia with target LDL less than 100 Low fat diet - rosuvastatin (CRESTOR) 10 MG tablet; Take 1 tablet (10 mg total) by mouth daily.  Dispense: 90 tablet; Refill: 1 - Lipid panel  3. Hypothyroidism, unspecified type - levothyroxine (SYNTHROID) 75 MCG tablet; Take 1 tablet (75 mcg total) by mouth daily.  Dispense: 30 tablet; Refill: 11  4. BMI 29.0-29.9,adult Discussed diet and exercise for person with BMI >25 Will recheck weight in 3-6 months    Labs pending Health Maintenance reviewed Diet and exercise encouraged  Follow up plan: 6 months    Mary-Margaret Hassell Done, FNP

## 2018-04-08 NOTE — Patient Instructions (Signed)

## 2018-04-09 LAB — LIPID PANEL
Chol/HDL Ratio: 2.1 ratio (ref 0.0–4.4)
Cholesterol, Total: 116 mg/dL (ref 100–199)
HDL: 54 mg/dL (ref >39)
LDL Calculated: 48 mg/dL (ref 0–99)
Triglycerides: 72 mg/dL (ref 0–149)
VLDL CHOLESTEROL CAL: 14 mg/dL (ref 5–40)

## 2018-04-09 LAB — CMP14+EGFR
ALT: 13 [IU]/L (ref 0–32)
AST: 18 [IU]/L (ref 0–40)
Albumin/Globulin Ratio: 2.1 (ref 1.2–2.2)
Albumin: 4.5 g/dL (ref 3.8–4.8)
Alkaline Phosphatase: 73 [IU]/L (ref 39–117)
BUN/Creatinine Ratio: 15 (ref 12–28)
BUN: 13 mg/dL (ref 8–27)
Bilirubin Total: 0.4 mg/dL (ref 0.0–1.2)
CO2: 22 mmol/L (ref 20–29)
Calcium: 9.2 mg/dL (ref 8.7–10.3)
Chloride: 104 mmol/L (ref 96–106)
Creatinine, Ser: 0.86 mg/dL (ref 0.57–1.00)
GFR calc Af Amer: 84 mL/min/{1.73_m2}
GFR calc non Af Amer: 73 mL/min/{1.73_m2}
Globulin, Total: 2.1 g/dL (ref 1.5–4.5)
Glucose: 97 mg/dL (ref 65–99)
Potassium: 4.7 mmol/L (ref 3.5–5.2)
Sodium: 144 mmol/L (ref 134–144)
Total Protein: 6.6 g/dL (ref 6.0–8.5)

## 2018-10-08 ENCOUNTER — Other Ambulatory Visit: Payer: Self-pay

## 2018-10-09 ENCOUNTER — Encounter: Payer: Self-pay | Admitting: Nurse Practitioner

## 2018-10-09 ENCOUNTER — Ambulatory Visit: Payer: Managed Care, Other (non HMO) | Admitting: Nurse Practitioner

## 2018-10-09 VITALS — BP 133/72 | HR 71 | Temp 97.3°F | Resp 16 | Ht 64.0 in | Wt 146.0 lb

## 2018-10-09 DIAGNOSIS — I10 Essential (primary) hypertension: Secondary | ICD-10-CM

## 2018-10-09 DIAGNOSIS — Z23 Encounter for immunization: Secondary | ICD-10-CM | POA: Diagnosis not present

## 2018-10-09 DIAGNOSIS — E039 Hypothyroidism, unspecified: Secondary | ICD-10-CM

## 2018-10-09 DIAGNOSIS — E785 Hyperlipidemia, unspecified: Secondary | ICD-10-CM | POA: Diagnosis not present

## 2018-10-09 DIAGNOSIS — Z6829 Body mass index (BMI) 29.0-29.9, adult: Secondary | ICD-10-CM | POA: Diagnosis not present

## 2018-10-09 MED ORDER — LEVOTHYROXINE SODIUM 75 MCG PO TABS: 75.0000 ug | ORAL_TABLET | Freq: Every day | ORAL | 1 refills | Status: DC

## 2018-10-09 MED ORDER — ROSUVASTATIN CALCIUM 10 MG PO TABS: 10.0000 mg | ORAL_TABLET | Freq: Every day | ORAL | 1 refills | Status: DC

## 2018-10-09 MED ORDER — LISINOPRIL 10 MG PO TABS: 10.0000 mg | ORAL_TABLET | Freq: Every day | ORAL | 1 refills | Status: DC

## 2018-10-09 NOTE — Patient Instructions (Signed)
Exercising to Stay Healthy To become healthy and stay healthy, it is recommended that you do moderate-intensity and vigorous-intensity exercise. You can tell that you are exercising at a moderate intensity if your heart starts beating faster and you start breathing faster but can still hold a conversation. You can tell that you are exercising at a vigorous intensity if you are breathing much harder and faster and cannot hold a conversation while exercising. Exercising regularly is important. It has many health benefits, such as:  Improving overall fitness, flexibility, and endurance.  Increasing bone density.  Helping with weight control.  Decreasing body fat.  Increasing muscle strength.  Reducing stress and tension.  Improving overall health. How often should I exercise? Choose an activity that you enjoy, and set realistic goals. Your health care provider can help you make an activity plan that works for you. Exercise regularly as told by your health care provider. This may include:  Doing strength training two times a week, such as: ? Lifting weights. ? Using resistance bands. ? Push-ups. ? Sit-ups. ? Yoga.  Doing a certain intensity of exercise for a given amount of time. Choose from these options: ? A total of 150 minutes of moderate-intensity exercise every week. ? A total of 75 minutes of vigorous-intensity exercise every week. ? A mix of moderate-intensity and vigorous-intensity exercise every week. Children, pregnant women, people who have not exercised regularly, people who are overweight, and older adults may need to talk with a health care provider about what activities are safe to do. If you have a medical condition, be sure to talk with your health care provider before you start a new exercise program. What are some exercise ideas? Moderate-intensity exercise ideas include:  Walking 1 mile (1.6 km) in about 15 minutes.  Biking.  Hiking.  Golfing.  Dancing.   Water aerobics. Vigorous-intensity exercise ideas include:  Walking 4.5 miles (7.2 km) or more in about 1 hour.  Jogging or running 5 miles (8 km) in about 1 hour.  Biking 10 miles (16.1 km) or more in about 1 hour.  Lap swimming.  Roller-skating or in-line skating.  Cross-country skiing.  Vigorous competitive sports, such as football, basketball, and soccer.  Jumping rope.  Aerobic dancing. What are some everyday activities that can help me to get exercise?  Yard work, such as: ? Pushing a lawn mower. ? Raking and bagging leaves.  Washing your car.  Pushing a stroller.  Shoveling snow.  Gardening.  Washing windows or floors. How can I be more active in my day-to-day activities?  Use stairs instead of an elevator.  Take a walk during your lunch break.  If you drive, park your car farther away from your work or school.  If you take public transportation, get off one stop early and walk the rest of the way.  Stand up or walk around during all of your indoor phone calls.  Get up, stretch, and walk around every 30 minutes throughout the day.  Enjoy exercise with a friend. Support to continue exercising will help you keep a regular routine of activity. What guidelines can I follow while exercising?  Before you start a new exercise program, talk with your health care provider.  Do not exercise so much that you hurt yourself, feel dizzy, or get very short of breath.  Wear comfortable clothes and wear shoes with good support.  Drink plenty of water while you exercise to prevent dehydration or heat stroke.  Work out until your breathing   and your heartbeat get faster. Where to find more information  [place] Department of Health and Human Services: www.hhs.gov  Centers for Disease Control and Prevention (CDC): www.cdc.gov Summary  Exercising regularly is important. It will improve your overall fitness, flexibility, and endurance.  Regular exercise also will  improve your overall health. It can help you control your weight, reduce stress, and improve your bone density.  Do not exercise so much that you hurt yourself, feel dizzy, or get very short of breath.  Before you start a new exercise program, talk with your health care provider. This information is not intended to replace advice given to you by your health care provider. Make sure you discuss any questions you have with your health care provider. Document Released: 02/10/2010 Document Revised: 12/21/2016 Document Reviewed: 11/29/2016 Elsevier Patient Education  2020 [employer] Inc.  

## 2018-10-09 NOTE — Addendum Note (Signed)
Addended by: Chevis Pretty on: 10/09/2018 09:04 AM   Modules accepted: Orders

## 2018-10-09 NOTE — Progress Notes (Signed)
Subjective:    Patient ID: Teresa Ayala, female    DOB: 03/05/1955, 63 y.o.   MRN: [id number]   Chief Complaint: Medical Management of Chronic Issues    HPI:  1. Essential hypertension No c/o chest pain,sob or headache.does not check blood pressure at home. BP Readings from Last 3 Encounters:  10/09/18 133/72  04/08/18 128/81  10/08/17 121/76     2. Hyperlipidemia with target LDL less than 100 Tries to watch diet and does stay very active Lab Results  Component Value Date   CHOL 116 04/08/2018   HDL 54 04/08/2018   LDLCALC 48 04/08/2018   TRIG 72 04/08/2018   CHOLHDL 2.1 04/08/2018     3. Hypothyroidism, unspecified type No problems that she is aware of  4. BMI 29.0-29.9,adult No recent weight changes Wt Readings from Last 3 Encounters:  10/09/18 146 lb (66.2 kg)  04/08/18 146 lb (66.2 kg)  10/08/17 145 lb (65.8 kg)   BMI Readings from Last 3 Encounters:  10/09/18 25.06 kg/m  04/08/18 25.06 kg/m  10/08/17 24.89 kg/m        Outpatient Encounter Medications as of 10/09/2018  Medication Sig  . levothyroxine (SYNTHROID) 75 MCG tablet Take 1 tablet (75 mcg total) by mouth daily.  Marland Kitchen. lisinopril (PRINIVIL,ZESTRIL) 10 MG tablet Take 1 tablet (10 mg total) by mouth daily.  . rosuvastatin (CRESTOR) 10 MG tablet Take 1 tablet (10 mg total) by mouth daily.      Past Surgical History:  Procedure Laterality Date  . CHOLECYSTECTOMY  2000    Family History  Problem Relation Age of Onset  . Cancer Father        LUNG  . Colon cancer Neg Hx     New complaints: None today  Social history: Lives with her husband- husband recovering from stroke.  Controlled substance contract: n/a    Review of Systems  Constitutional: Negative for activity change and appetite change.  HENT: Negative.   Eyes: Negative for pain.  Respiratory: Negative for shortness of breath.   Cardiovascular: Negative for chest pain, palpitations and leg swelling.  Gastrointestinal:  Negative for abdominal pain.  Endocrine: Negative for polydipsia.  Genitourinary: Negative.   Skin: Negative for rash.  Neurological: Negative for dizziness, weakness and headaches.  Hematological: Does not bruise/bleed easily.  Psychiatric/Behavioral: Negative.   All other systems reviewed and are negative.      Objective:   Physical Exam Vitals signs and nursing note reviewed.  Constitutional:      General: She is not in acute distress.    Appearance: Normal appearance. She is well-developed.  HENT:     Head: Normocephalic.     Nose: Nose normal.  Eyes:     Pupils: Pupils are equal, round, and reactive to light.  Neck:     Musculoskeletal: Normal range of motion and neck supple.     Vascular: No carotid bruit or JVD.  Cardiovascular:     Rate and Rhythm: Normal rate and regular rhythm.     Heart sounds: Normal heart sounds.  Pulmonary:     Effort: Pulmonary effort is normal. No respiratory distress.     Breath sounds: Normal breath sounds. No wheezing or rales.  Chest:     Chest wall: No tenderness.  Abdominal:     General: Bowel sounds are normal. There is no distension or abdominal bruit.     Palpations: Abdomen is soft. There is no hepatomegaly, splenomegaly, mass or pulsatile mass.     Tenderness:  There is no abdominal tenderness.  Musculoskeletal: Normal range of motion.  Lymphadenopathy:     Cervical: No cervical adenopathy.  Skin:    General: Skin is warm and dry.  Neurological:     Mental Status: She is alert and oriented to person, place, and time.     Deep Tendon Reflexes: Reflexes are normal and symmetric.  Psychiatric:        Behavior: Behavior normal.        Thought Content: Thought content normal.        Judgment: Judgment normal.    BP 133/72   Pulse 71   Temp (!) 97.3 F (36.3 C) (Oral)   Resp 16   Ht 5\' 4"  (1.626 m)   Wt 146 lb (66.2 kg)   SpO2 100%   BMI 25.06 kg/m         Assessment & Plan:  Teresa Ayala comes in today with chief  complaint of Medical Management of Chronic Issues   Diagnosis and orders addressed:  1. Essential hypertension Low sodium diet - lisinopril (ZESTRIL) 10 MG tablet; Take 1 tablet (10 mg total) by mouth daily.  Dispense: 90 tablet; Refill: 1  2. Hyperlipidemia with target LDL less than 100 Low fat diet - rosuvastatin (CRESTOR) 10 MG tablet; Take 1 tablet (10 mg total) by mouth daily.  Dispense: 90 tablet; Refill: 1  3. Hypothyroidism, unspecified type - levothyroxine (SYNTHROID) 75 MCG tablet; Take 1 tablet (75 mcg total) by mouth daily.  Dispense: 90 tablet; Refill: 1  4. BMI 29.0-29.9,adult Discussed diet and exercise for person with BMI >25 Will recheck weight in 3-6 months  Keep appointment formammogram Labs pending Health Maintenance reviewed Diet and exercise encouraged  Follow up plan: 6 months   Mary-Margaret Hassell Done, FNP

## 2018-10-10 LAB — CMP14+EGFR
ALT: 16 IU/L (ref 0–32)
AST: 20 IU/L (ref 0–40)
Albumin/Globulin Ratio: 2 (ref 1.2–2.2)
Albumin: 4.5 g/dL (ref 3.8–4.8)
Alkaline Phosphatase: 74 IU/L (ref 39–117)
BUN/Creatinine Ratio: 16 (ref 12–28)
BUN: 15 mg/dL (ref 8–27)
Bilirubin Total: 0.5 mg/dL (ref 0.0–1.2)
CO2: 26 mmol/L (ref 20–29)
Calcium: 9.3 mg/dL (ref 8.7–10.3)
Chloride: 104 mmol/L (ref 96–106)
Creatinine, Ser: 0.93 mg/dL (ref 0.57–1.00)
GFR calc Af Amer: 76 mL/min/{1.73_m2} (ref >59)
GFR calc non Af Amer: 66 mL/min/{1.73_m2} (ref >59)
Globulin, Total: 2.3 g/dL (ref 1.5–4.5)
Glucose: 94 mg/dL (ref 65–99)
Potassium: 4.2 mmol/L (ref 3.5–5.2)
Sodium: 140 mmol/L (ref 134–144)
Total Protein: 6.8 g/dL (ref 6.0–8.5)

## 2018-10-10 LAB — LIPID PANEL
Chol/HDL Ratio: 2.3 ratio (ref 0.0–4.4)
Cholesterol, Total: 124 mg/dL (ref 100–199)
HDL: 54 mg/dL (ref >39)
LDL Chol Calc (NIH): 51 mg/dL (ref 0–99)
Triglycerides: 100 mg/dL (ref 0–149)
VLDL Cholesterol Cal: 19 mg/dL (ref 5–40)

## 2018-11-14 DIAGNOSIS — R928 Other abnormal and inconclusive findings on diagnostic imaging of breast: Secondary | ICD-10-CM

## 2018-11-14 NOTE — Progress Notes (Signed)
Abnormal/incomplete screening mammogram additional imaging recommended.  Orders placed and signed copy will be faxed to [facility]. MPulliam, CMA/RT(R)

## 2019-01-22 ENCOUNTER — Other Ambulatory Visit: Payer: 59

## 2019-01-22 ENCOUNTER — Ambulatory Visit: Payer: Managed Care, Other (non HMO) | Attending: Internal Medicine

## 2019-01-22 DIAGNOSIS — Z20822 Contact with and (suspected) exposure to covid-19: Secondary | ICD-10-CM

## 2019-01-23 LAB — NOVEL CORONAVIRUS, NAA: SARS-CoV-2, NAA: NOT DETECTED

## 2019-03-03 ENCOUNTER — Ambulatory Visit: Payer: Managed Care, Other (non HMO) | Attending: Internal Medicine

## 2019-03-03 ENCOUNTER — Other Ambulatory Visit: Payer: Self-pay

## 2019-03-03 DIAGNOSIS — Z20822 Contact with and (suspected) exposure to covid-19: Secondary | ICD-10-CM | POA: Insufficient documentation

## 2019-03-04 ENCOUNTER — Encounter: Payer: Self-pay | Admitting: Nurse Practitioner

## 2019-03-04 LAB — NOVEL CORONAVIRUS, NAA: SARS-CoV-2, NAA: NOT DETECTED

## 2019-03-06 ENCOUNTER — Ambulatory Visit: Payer: Managed Care, Other (non HMO) | Attending: Internal Medicine

## 2019-03-06 ENCOUNTER — Other Ambulatory Visit: Payer: Self-pay

## 2019-03-06 DIAGNOSIS — Z20822 Contact with and (suspected) exposure to covid-19: Secondary | ICD-10-CM

## 2019-03-07 LAB — NOVEL CORONAVIRUS, NAA: SARS-CoV-2, NAA: NOT DETECTED

## 2019-03-09 ENCOUNTER — Telehealth (INDEPENDENT_AMBULATORY_CARE_PROVIDER_SITE_OTHER): Payer: Managed Care, Other (non HMO) | Admitting: Nurse Practitioner

## 2019-03-09 ENCOUNTER — Encounter: Payer: Self-pay | Admitting: Nurse Practitioner

## 2019-03-09 DIAGNOSIS — R5081 Fever presenting with conditions classified elsewhere: Secondary | ICD-10-CM

## 2019-03-09 MED ORDER — AZITHROMYCIN 250 MG PO TABS: ORAL_TABLET | ORAL | 0 refills | Status: DC

## 2019-03-09 NOTE — Progress Notes (Signed)
   Virtual Visit via telephone Note  I connected with Teresa Ayala on 03/09/19 at 9:10 by video and verified that I am speaking with the correct person using two identifiers. Nolan Lasser is currently located at work and no one  is currently with her during visit. The provider, Mary-Margaret Daphine Deutscher, FNP is located in their office at time of visit.  I discussed the limitations, risks, security and privacy concerns of performing an evaluation and management service by telephone and the availability of in person appointments. I also discussed with the patient that there may be a patient responsible charge related to this service. The patient expressed understanding and agreed to proceed.   History and Present Illness:   Chief Complaint: headache and fever  HPI Patient calls in c/o headache and fever for several days. She has had 2 covid tests and both were negative. Fever is running around 100.2 this morning. She has no cough or congestion.    Review of Systems  Constitutional: Positive for chills and fever. Negative for malaise/fatigue.  HENT: Negative for congestion, ear pain and sore throat.   Respiratory: Negative for cough.   Gastrointestinal: Negative.   Genitourinary: Negative.   Musculoskeletal: Negative.   Neurological: Positive for headaches.  Psychiatric/Behavioral: Negative.   All other systems reviewed and are negative.      Observations/Objective: Alert and oriented- answers all questions appropriately No distress    Assessment and Plan: Teresa Ayala in today with chief complaint of Headache   1. Fever in other diseases Continue motirn or tylenol OTC as needed for fever Will give antibiotic just for coverage since fever has bene going on for 6 days Force fluids - azithromycin (ZITHROMAX Z-PAK) 250 MG tablet; As directed  Dispense: 6 tablet; Refill: 0     Follow Up Instructions: prn    I discussed the assessment and treatment plan with the patient. The patient  was provided an opportunity to ask questions and all were answered. The patient agreed with the plan and demonstrated an understanding of the instructions.   The patient was advised to call back or seek an in-person evaluation if the symptoms worsen or if the condition fails to improve as anticipated.  The above assessment and management plan was discussed with the patient. The patient verbalized understanding of and has agreed to the management plan. Patient is aware to call the clinic if symptoms persist or worsen. Patient is aware when to return to the clinic for a follow-up visit. Patient educated on when it is appropriate to go to the emergency department.   Time call ended:9:22  I provided 12 minutes of face-to-face time during this encounter.    Mary-Margaret Daphine Deutscher, FNP

## 2019-04-01 ENCOUNTER — Other Ambulatory Visit: Payer: Self-pay | Admitting: Nurse Practitioner

## 2019-04-01 DIAGNOSIS — I10 Essential (primary) hypertension: Secondary | ICD-10-CM

## 2019-04-09 ENCOUNTER — Encounter: Payer: Self-pay | Admitting: Nurse Practitioner

## 2019-04-09 ENCOUNTER — Ambulatory Visit (INDEPENDENT_AMBULATORY_CARE_PROVIDER_SITE_OTHER): Payer: Managed Care, Other (non HMO) | Admitting: Nurse Practitioner

## 2019-04-09 DIAGNOSIS — Z6829 Body mass index (BMI) 29.0-29.9, adult: Secondary | ICD-10-CM

## 2019-04-09 DIAGNOSIS — E039 Hypothyroidism, unspecified: Secondary | ICD-10-CM

## 2019-04-09 DIAGNOSIS — I10 Essential (primary) hypertension: Secondary | ICD-10-CM | POA: Diagnosis not present

## 2019-04-09 DIAGNOSIS — E785 Hyperlipidemia, unspecified: Secondary | ICD-10-CM

## 2019-04-09 MED ORDER — LISINOPRIL 10 MG PO TABS: 10.0000 mg | ORAL_TABLET | Freq: Every day | ORAL | 1 refills | Status: DC

## 2019-04-09 MED ORDER — LEVOTHYROXINE SODIUM 75 MCG PO TABS: 75.0000 ug | ORAL_TABLET | Freq: Every day | ORAL | 1 refills | Status: DC

## 2019-04-09 MED ORDER — ROSUVASTATIN CALCIUM 10 MG PO TABS: 10.0000 mg | ORAL_TABLET | Freq: Every day | ORAL | 1 refills | Status: DC

## 2019-04-09 NOTE — Progress Notes (Signed)
Virtual Visit via telephone Note Due to COVID-19 pandemic this visit was conducted virtually. This visit type was conducted due to national recommendations for restrictions regarding the COVID-19 Pandemic (e.g. social distancing, sheltering in place) in an effort to limit this patient's exposure and mitigate transmission in our community. All issues noted in this document were discussed and addressed.  A physical exam was not performed with this format.  I connected with Teresa Ayala on 04/09/19 at 8:15 by telephone and verified that I am speaking with the correct person using two identifiers. Teresa Ayala is currently located at home and ner husband is currently with her during visit. The provider, Mary-Margaret Hassell Done, FNP is located in their office at time of visit.  I discussed the limitations, risks, security and privacy concerns of performing an evaluation and management service by telephone and the availability of in person appointments. I also discussed with the patient that there may be a patient responsible charge related to this service. The patient expressed understanding and agreed to proceed.   History and Present Illness:   Chief Complaint: Medical Management of Chronic Issues    HPI:  1. Essential hypertension No c/o chest pain, sob, or headache. Does chek blood pressure on occasion. This morning it was 109/60. BP Readings from Last 3 Encounters:  10/09/18 133/72  04/08/18 128/81  10/08/17 121/76     2. Hyperlipidemia with target LDL less than 100 Has not really been watching her diet and does very little exercise. Lab Results  Component Value Date   CHOL 124 10/09/2018   HDL 54 10/09/2018   LDLCALC 51 10/09/2018   TRIG 100 10/09/2018   CHOLHDL 2.3 10/09/2018     3. Hypothyroidism, unspecified type No problems that aware of. Lab Results  Component Value Date   TSH 0.177 (L) 10/08/2017     4. BMI 29.0-29.9,adult No recent weight changes. Wt Readings from  Last 3 Encounters:  10/09/18 146 lb (66.2 kg)  04/08/18 146 lb (66.2 kg)  10/08/17 145 lb (65.8 kg)   BMI Readings from Last 3 Encounters:  10/09/18 25.06 kg/m  04/08/18 25.06 kg/m  10/08/17 24.89 kg/m       Outpatient Encounter Medications as of 04/09/2019  Medication Sig  . azithromycin (ZITHROMAX Z-PAK) 250 MG tablet As directed  . levothyroxine (SYNTHROID) 75 MCG tablet Take 1 tablet (75 mcg total) by mouth daily.  Marland Kitchen lisinopril (ZESTRIL) 10 MG tablet TAKE 1 TABLET BY MOUTH EVERY DAY  . rosuvastatin (CRESTOR) 10 MG tablet Take 1 tablet (10 mg total) by mouth daily.     Past Surgical History:  Procedure Laterality Date  . CHOLECYSTECTOMY  2000    Family History  Problem Relation Age of Onset  . Cancer Father        LUNG  . Colon cancer Neg Hx     New complaints: None today  Social history: Lives with husband. 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  Controlled substance contract: n/a    Review of Systems  Constitutional: Negative for diaphoresis and weight loss.  Eyes: Negative for blurred vision, double vision and pain.  Respiratory: Negative for shortness of breath.   Cardiovascular: Negative for chest pain, palpitations, orthopnea and leg swelling.  Gastrointestinal: Negative for abdominal pain.  Skin: Negative for rash.  Neurological: Negative for dizziness, sensory change, loss of consciousness, weakness and headaches.  Endo/Heme/Allergies: Negative for polydipsia. Does not bruise/bleed easily.  Psychiatric/Behavioral: Negative for memory loss. The patient does not have  insomnia.   All other systems reviewed and are negative.    Observations/Objective: Alert and oriented- answers all questions appropriately No distress    Assessment and Plan: [place] comes in today with chief complaint of Medical Management of Chronic Issues   Diagnosis and orders addressed:  1. Essential hypertension Low sodium diet - lisinopril (ZESTRIL)  10 MG tablet; Take 1 tablet (10 mg total) by mouth daily.  Dispense: 90 tablet; Refill: 1  2. Hyperlipidemia with target LDL less than 100 Low fat diet - rosuvastatin (CRESTOR) 10 MG tablet; Take 1 tablet (10 mg total) by mouth daily.  Dispense: 90 tablet; Refill: 1  3. Hypothyroidism, unspecified type - levothyroxine (SYNTHROID) 75 MCG tablet; Take 1 tablet (75 mcg total) by mouth daily.  Dispense: 90 tablet; Refill: 1  4. BMI 29.0-29.9,adult Discussed diet and exercise for person with BMI >25 Will recheck weight in 3-6 months    Labs pending Health Maintenance reviewed Diet and exercise encouraged  Follow up plan: 6 months      I discussed the assessment and treatment plan with the patient. The patient was provided an opportunity to ask questions and all were answered. The patient agreed with the plan and demonstrated an understanding of the instructions.   The patient was advised to call back or seek an in-person evaluation if the symptoms worsen or if the condition fails to improve as anticipated.  The above assessment and management plan was discussed with the patient. The patient verbalized understanding of and has agreed to the management plan. Patient is aware to call the clinic if symptoms persist or worsen. Patient is aware when to return to the clinic for a follow-up visit. Patient educated on when it is appropriate to go to the emergency department.   Time call ended:  830  I provided 15 minutes of non-face-to-face time during this encounter.    Mary-Margaret Daphine Deutscher, FNP

## 2019-04-10 ENCOUNTER — Other Ambulatory Visit: Payer: Self-pay

## 2019-04-10 ENCOUNTER — Other Ambulatory Visit: Payer: Managed Care, Other (non HMO)

## 2019-04-10 DIAGNOSIS — I10 Essential (primary) hypertension: Secondary | ICD-10-CM

## 2019-04-10 DIAGNOSIS — E039 Hypothyroidism, unspecified: Secondary | ICD-10-CM

## 2019-04-10 DIAGNOSIS — E785 Hyperlipidemia, unspecified: Secondary | ICD-10-CM

## 2019-04-11 LAB — CMP14+EGFR
ALT: 13 IU/L (ref 0–32)
AST: 23 IU/L (ref 0–40)
Albumin/Globulin Ratio: 1.9 (ref 1.2–2.2)
Albumin: 4.3 g/dL (ref 3.8–4.8)
Alkaline Phosphatase: 80 IU/L (ref 39–117)
BUN/Creatinine Ratio: 22 (ref 12–28)
BUN: 20 mg/dL (ref 8–27)
Bilirubin Total: 0.3 mg/dL (ref 0.0–1.2)
CO2: 24 mmol/L (ref 20–29)
Calcium: 9.1 mg/dL (ref 8.7–10.3)
Chloride: 103 mmol/L (ref 96–106)
Creatinine, Ser: 0.89 mg/dL (ref 0.57–1.00)
GFR calc Af Amer: 80 mL/min/{1.73_m2} (ref >59)
GFR calc non Af Amer: 70 mL/min/{1.73_m2} (ref >59)
Globulin, Total: 2.3 g/dL (ref 1.5–4.5)
Glucose: 98 mg/dL (ref 65–99)
Potassium: 4.4 mmol/L (ref 3.5–5.2)
Sodium: 141 mmol/L (ref 134–144)
Total Protein: 6.6 g/dL (ref 6.0–8.5)

## 2019-04-11 LAB — CBC WITH DIFFERENTIAL/PLATELET
Basophils Absolute: 0 10*3/uL (ref 0.0–0.2)
Basos: 1 %
EOS (ABSOLUTE): 0.1 10*3/uL (ref 0.0–0.4)
Eos: 2 %
Hematocrit: 37.9 % (ref 34.0–46.6)
Hemoglobin: 12.8 g/dL (ref 11.1–15.9)
Immature Grans (Abs): 0 10*3/uL (ref 0.0–0.1)
Immature Granulocytes: 0 %
Lymphocytes Absolute: 2 10*3/uL (ref 0.7–3.1)
Lymphs: 29 %
MCH: 32.7 pg (ref 26.6–33.0)
MCHC: 33.8 g/dL (ref 31.5–35.7)
MCV: 97 fL (ref 79–97)
Monocytes Absolute: 0.6 10*3/uL (ref 0.1–0.9)
Monocytes: 9 %
Neutrophils Absolute: 4 10*3/uL (ref 1.4–7.0)
Neutrophils: 59 %
Platelets: 208 10*3/uL (ref 150–450)
RBC: 3.92 x10E6/uL (ref 3.77–5.28)
RDW: 12.8 % (ref 11.7–15.4)
WBC: 6.8 10*3/uL (ref 3.4–10.8)

## 2019-04-11 LAB — LIPID PANEL
Chol/HDL Ratio: 2.1 ratio (ref 0.0–4.4)
Cholesterol, Total: 116 mg/dL (ref 100–199)
HDL: 55 mg/dL (ref >39)
LDL Chol Calc (NIH): 44 mg/dL (ref 0–99)
Triglycerides: 90 mg/dL (ref 0–149)
VLDL Cholesterol Cal: 17 mg/dL (ref 5–40)

## 2019-04-11 LAB — THYROID PANEL WITH TSH
Free Thyroxine Index: 1.8 (ref 1.2–4.9)
T3 Uptake Ratio: 28 % (ref 24–39)
T4, Total: 6.5 ug/dL (ref 4.5–12.0)
TSH: 2.99 u[IU]/mL (ref 0.450–4.500)

## 2019-07-20 ENCOUNTER — Encounter: Payer: Self-pay | Admitting: Nurse Practitioner

## 2019-07-22 ENCOUNTER — Other Ambulatory Visit: Payer: Self-pay | Admitting: Urology

## 2019-07-24 ENCOUNTER — Encounter (HOSPITAL_BASED_OUTPATIENT_CLINIC_OR_DEPARTMENT_OTHER): Payer: Self-pay | Admitting: Certified Registered"

## 2019-07-28 NOTE — Progress Notes (Signed)
Call completed and patient is aware that she needs to arrive at 0645. NPO after midnight and wear loose clothing.  Medications reviewed and patient is aware to not have any NSAIDs until her procedure. She is not received her blue folder in the mail.  Office is aware.

## 2019-07-30 ENCOUNTER — Ambulatory Visit (HOSPITAL_BASED_OUTPATIENT_CLINIC_OR_DEPARTMENT_OTHER)
Admission: RE | Admit: 2019-07-30 | Discharge: 2019-07-30 | Disposition: A | Discharge status: Home / Self Care | Payer: Managed Care, Other (non HMO) | Attending: Urology | Admitting: Urology

## 2019-07-30 ENCOUNTER — Other Ambulatory Visit: Payer: Self-pay

## 2019-07-30 ENCOUNTER — Ambulatory Visit (HOSPITAL_COMMUNITY): Payer: Managed Care, Other (non HMO)

## 2019-07-30 ENCOUNTER — Encounter (HOSPITAL_BASED_OUTPATIENT_CLINIC_OR_DEPARTMENT_OTHER): Payer: Self-pay | Admitting: Urology

## 2019-07-30 ENCOUNTER — Encounter (HOSPITAL_BASED_OUTPATIENT_CLINIC_OR_DEPARTMENT_OTHER)
Admission: RE | Disposition: A | Discharge status: Home / Self Care | Payer: Self-pay | Source: Home / Self Care | Attending: Urology

## 2019-07-30 DIAGNOSIS — Z801 Family history of malignant neoplasm of trachea, bronchus and lung: Secondary | ICD-10-CM | POA: Insufficient documentation

## 2019-07-30 DIAGNOSIS — Z79899 Other long term (current) drug therapy: Secondary | ICD-10-CM | POA: Diagnosis not present

## 2019-07-30 DIAGNOSIS — N201 Calculus of ureter: Secondary | ICD-10-CM | POA: Diagnosis not present

## 2019-07-30 DIAGNOSIS — E079 Disorder of thyroid, unspecified: Secondary | ICD-10-CM | POA: Diagnosis not present

## 2019-07-30 DIAGNOSIS — Z9049 Acquired absence of other specified parts of digestive tract: Secondary | ICD-10-CM | POA: Diagnosis not present

## 2019-07-30 DIAGNOSIS — Z01818 Encounter for other preprocedural examination: Secondary | ICD-10-CM

## 2019-07-30 DIAGNOSIS — I10 Essential (primary) hypertension: Secondary | ICD-10-CM | POA: Diagnosis not present

## 2019-07-30 HISTORY — PX: EXTRACORPOREAL SHOCK WAVE LITHOTRIPSY: SHX1557

## 2019-07-30 SURGERY — LITHOTRIPSY, ESWL
Anesthesia: LOCAL | Laterality: Left

## 2019-07-30 MED ORDER — SODIUM CHLORIDE 0.9 % IV SOLN: INTRAVENOUS | Status: DC

## 2019-07-30 MED ORDER — DIAZEPAM 5 MG PO TABS
ORAL_TABLET | ORAL | Status: AC
  Filled 2019-07-30: qty 2

## 2019-07-30 MED ORDER — LEVOFLOXACIN 500 MG PO TABS
500.0000 mg | ORAL_TABLET | ORAL | Status: AC
  Administered 2019-07-30: 500 mg via ORAL
  Filled 2019-07-30: qty 1

## 2019-07-30 MED ORDER — DIPHENHYDRAMINE HCL 25 MG PO CAPS
25.0000 mg | ORAL_CAPSULE | ORAL | Status: AC
  Administered 2019-07-30: 25 mg via ORAL

## 2019-07-30 MED ORDER — TRAMADOL HCL 50 MG PO TABS: 50.0000 mg | ORAL_TABLET | Freq: Four times a day (QID) | ORAL | 0 refills | Status: DC | PRN

## 2019-07-30 MED ORDER — DIPHENHYDRAMINE HCL 25 MG PO CAPS
ORAL_CAPSULE | ORAL | Status: AC
  Filled 2019-07-30: qty 1

## 2019-07-30 MED ORDER — DIAZEPAM 5 MG PO TABS
10.0000 mg | ORAL_TABLET | ORAL | Status: AC
  Administered 2019-07-30: 10 mg via ORAL

## 2019-07-30 NOTE — H&P (Signed)
Teresa Ayala is an 64 y.o. female.    Chief Complaint: Pre-LEFT Shockwave Lithotripsy  HPI:   1- LEFT Ureteral Stone - 27mm left proximal stone at L3 transverse process level by CT at [facility] 06/2019. UCX negative. Stone is solitary.   Today "Teresa Ayala" is seen to proceed with LEFT SWL. NO interval fevers.   Past Medical History:  Diagnosis Date  . Hypertension   . Thyroid disease     Past Surgical History:  Procedure Laterality Date  . CHOLECYSTECTOMY  2000    Family History  Problem Relation Age of Onset  . Cancer Father        LUNG  . Colon cancer Neg Hx    Social History:  reports that she has never smoked. She has never used smokeless tobacco. She reports current alcohol use. She reports that she does not use drugs.  Allergies: No Known Allergies  Medications Prior to Admission  Medication Sig Dispense Refill  . traMADol (ULTRAM) 50 MG tablet Take by mouth every 6 (six) hours as needed.    Marland Kitchen levothyroxine (SYNTHROID) 75 MCG tablet Take 1 tablet (75 mcg total) by mouth daily. 90 tablet 1  . lisinopril (ZESTRIL) 10 MG tablet Take 1 tablet (10 mg total) by mouth daily. 90 tablet 1  . rosuvastatin (CRESTOR) 10 MG tablet Take 1 tablet (10 mg total) by mouth daily. 90 tablet 1    No results found for this or any previous visit (from the past 48 hour(s)). No results found.  Review of Systems  Constitutional: Negative for chills and fever.  Genitourinary: Positive for flank pain.  All other systems reviewed and are negative.   There were no vitals taken for this visit. Physical Exam Vitals reviewed.  HENT:     Head: Normocephalic.     Nose: Nose normal.  Cardiovascular:     Rate and Rhythm: Normal rate.     Pulses: Normal pulses.  Pulmonary:     Effort: Pulmonary effort is normal.  Abdominal:     General: Abdomen is flat.  Genitourinary:    Comments: Very mild left CVAT at present.  Musculoskeletal:        General: Normal range of motion.     Cervical back:  Normal range of motion.  Skin:    General: Skin is warm.  Neurological:     General: No focal deficit present.     Mental Status: She is alert.      Assessment/Plan  Proceed as planned with LEFT shockwave lithotripsy. Risks, benefits, alternatives, expected peri-treatment course discussed previously and reiterated today.   Sebastian Ache, MD 07/30/2019, 6:51 AM

## 2019-07-30 NOTE — Discharge Instructions (Signed)
1 - You may have urinary urgency (bladder spasms), pass small stone fragmetns, and bloody urine on / off for up to 2 weeks. This is normal.  2 - Call MD or go to ER for fever >102, severe pain / nausea / vomiting not relieved by medications, or acute change in medical status

## 2019-07-30 NOTE — Brief Op Note (Signed)
07/30/2019  9:41 AM  PATIENT:  Teresa Ayala  64 y.o. female  PRE-OPERATIVE DIAGNOSIS:  LEFT URETERAL STONE  POST-OPERATIVE DIAGNOSIS:  * No post-op diagnosis entered *  PROCEDURE:  Procedure(s): LEFT EXTRACORPOREAL SHOCK WAVE LITHOTRIPSY (ESWL) (Left)  SURGEON:  Surgeon(s) and Role:    * Alexis Frock, MD - Primary  PHYSICIAN ASSISTANT:   ASSISTANTS: none   ANESTHESIA:   MAC  EBL:  minimal   BLOOD ADMINISTERED:none  DRAINS: none   LOCAL MEDICATIONS USED:  NONE  SPECIMEN:  No Specimen  DISPOSITION OF SPECIMEN:  N/A  COUNTS:  YES  TOURNIQUET:  * No tourniquets in log *  DICTATION: .Note written in paper chart  PLAN OF CARE: Discharge to home after PACU  PATIENT DISPOSITION:  Short Stay   Delay start of Pharmacological VTE agent (>24hrs) due to surgical blood loss or risk of bleeding: not applicable

## 2019-07-31 ENCOUNTER — Encounter (HOSPITAL_BASED_OUTPATIENT_CLINIC_OR_DEPARTMENT_OTHER): Payer: Self-pay | Admitting: Urology

## 2019-10-12 ENCOUNTER — Other Ambulatory Visit: Payer: Self-pay

## 2019-10-12 ENCOUNTER — Ambulatory Visit: Payer: Managed Care, Other (non HMO) | Admitting: Nurse Practitioner

## 2019-10-12 ENCOUNTER — Encounter: Payer: Self-pay | Admitting: Nurse Practitioner

## 2019-10-12 VITALS — BP 124/64 | HR 66 | Temp 97.5°F | Resp 20 | Ht 64.0 in | Wt 134.0 lb

## 2019-10-12 DIAGNOSIS — E039 Hypothyroidism, unspecified: Secondary | ICD-10-CM

## 2019-10-12 DIAGNOSIS — E785 Hyperlipidemia, unspecified: Secondary | ICD-10-CM

## 2019-10-12 DIAGNOSIS — Z6829 Body mass index (BMI) 29.0-29.9, adult: Secondary | ICD-10-CM | POA: Diagnosis not present

## 2019-10-12 DIAGNOSIS — I10 Essential (primary) hypertension: Secondary | ICD-10-CM

## 2019-10-12 MED ORDER — LISINOPRIL 10 MG PO TABS: 10.0000 mg | ORAL_TABLET | Freq: Every day | ORAL | 1 refills | Status: DC

## 2019-10-12 MED ORDER — LEVOTHYROXINE SODIUM 75 MCG PO TABS: 75.0000 ug | ORAL_TABLET | Freq: Every day | ORAL | 1 refills | Status: DC

## 2019-10-12 MED ORDER — ROSUVASTATIN CALCIUM 10 MG PO TABS: 10.0000 mg | ORAL_TABLET | Freq: Every day | ORAL | 1 refills | Status: DC

## 2019-10-12 NOTE — Progress Notes (Signed)
Subjective:    Patient ID: Teresa Ayala, female    DOB: 24-Jun-1955, 64 y.o.   MRN: [id number]   Chief Complaint: Medical Management of Chronic Issues    HPI:  1. Essential hypertension No c/o chest pain, sob or headache. Does not check blood pressure at home. BP Readings from Last 3 Encounters:  10/12/19 124/64  07/30/19 139/71  10/09/18 133/72     2. Hyperlipidemia with target LDL less than 100 Does try to wtahc diet and does occasional exercise. Lab Results  Component Value Date   CHOL 116 04/10/2019   HDL 55 04/10/2019   LDLCALC 44 04/10/2019   TRIG 90 04/10/2019   CHOLHDL 2.1 04/10/2019     3. Acquired hypothyroidism Denies nay problems that she is aware of. Lab Results  Component Value Date   TSH 2.990 04/10/2019     4. BMI 29.0-29.9,adult Weight is down 6lbs since last visit Wt Readings from Last 3 Encounters:  10/12/19 134 lb (60.8 kg)  07/30/19 140 lb 3.2 oz (63.6 kg)  10/09/18 146 lb (66.2 kg)   BMI Readings from Last 3 Encounters:  10/12/19 23.00 kg/m  07/30/19 24.07 kg/m  10/09/18 25.06 kg/m       Outpatient Encounter Medications as of 10/12/2019  Medication Sig  . levothyroxine (SYNTHROID) 75 MCG tablet Take 1 tablet (75 mcg total) by mouth daily.  Marland Kitchen lisinopril (ZESTRIL) 10 MG tablet Take 1 tablet (10 mg total) by mouth daily.  . rosuvastatin (CRESTOR) 10 MG tablet Take 1 tablet (10 mg total) by mouth daily.    Past Surgical History:  Procedure Laterality Date  . CHOLECYSTECTOMY  2000  . EXTRACORPOREAL SHOCK WAVE LITHOTRIPSY Left 07/30/2019   Procedure: LEFT EXTRACORPOREAL SHOCK WAVE LITHOTRIPSY (ESWL);  Surgeon: Alexis Frock, MD;  Location: [facility];  Service: Urology;  Laterality: Left;    Family History  Problem Relation Age of Onset  . Cancer Father        LUNG  . 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  Controlled substance  contract: n/a    Review of Systems  Constitutional: Negative for diaphoresis.  Eyes: Negative for pain.  Respiratory: Negative for shortness of breath.   Cardiovascular: Negative for chest pain, palpitations and leg swelling.  Gastrointestinal: Negative for abdominal pain.  Endocrine: Negative for polydipsia.  Skin: Negative for rash.  Neurological: Negative for dizziness, weakness and headaches.  Hematological: Does not bruise/bleed easily.  All other systems reviewed and are negative.      Objective:   Physical Exam Vitals and nursing note reviewed.  Constitutional:      General: She is not in acute distress.    Appearance: Normal appearance. She is well-developed.  HENT:     Head: Normocephalic.     Nose: Nose normal.  Eyes:     Pupils: Pupils are equal, round, and reactive to light.  Neck:     Vascular: No carotid bruit or JVD.  Cardiovascular:     Rate and Rhythm: Normal rate and regular rhythm.     Heart sounds: Normal heart sounds.  Pulmonary:     Effort: Pulmonary effort is normal. No respiratory distress.     Breath sounds: Normal breath sounds. No wheezing or rales.  Chest:     Chest wall: No tenderness.  Abdominal:     General: Bowel sounds are normal. There is no distension or abdominal  bruit.     Palpations: Abdomen is soft. There is no hepatomegaly, splenomegaly, mass or pulsatile mass.     Tenderness: There is no abdominal tenderness.  Musculoskeletal:        General: Normal range of motion.     Cervical back: Normal range of motion and neck supple.  Lymphadenopathy:     Cervical: No cervical adenopathy.  Skin:    General: Skin is warm and dry.  Neurological:     Mental Status: She is alert and oriented to person, place, and time.     Deep Tendon Reflexes: Reflexes are normal and symmetric.  Psychiatric:        Behavior: Behavior normal.        Thought Content: Thought content normal.        Judgment: Judgment normal.     BP 124/64   Pulse  66   Temp (!) 97.5 F (36.4 C) (Temporal)   Resp 20   Ht 5' 4"  (1.626 m)   Wt 134 lb (60.8 kg)   SpO2 99%   BMI 23.00 kg/m        Assessment & Plan:  Teresa Ayala comes in today with chief complaint of Medical Management of Chronic Issues   Diagnosis and orders addressed:  1. Essential hypertension low sodium diet - CBC with Differential/Platelet - CMP14+EGFR - lisinopril (ZESTRIL) 10 MG tablet; Take 1 tablet (10 mg total) by mouth daily.  Dispense: 90 tablet; Refill: 1  2. Hyperlipidemia with target LDL less than 100 Low fat diet - Lipid panel - rosuvastatin (CRESTOR) 10 MG tablet; Take 1 tablet (10 mg total) by mouth daily.  Dispense: 90 tablet; Refill: 1  3. Acquired hypothyroidism Labs pending - Thyroid Panel With TSH - levothyroxine (SYNTHROID) 75 MCG tablet; Take 1 tablet (75 mcg total) by mouth daily.  Dispense: 90 tablet; Refill: 1  4. BMI 29.0-29.9,adult Discussed diet and exercise for person with BMI >25 Will recheck weight in 3-6 months     Labs pending Health Maintenance reviewed Diet and exercise encouraged  Follow up plan: 6 months   Mary-Margaret Hassell Done, FNP

## 2019-10-12 NOTE — Patient Instructions (Signed)
Exercising to Stay Healthy To become healthy and stay healthy, it is recommended that you do moderate-intensity and vigorous-intensity exercise. You can tell that you are exercising at a moderate intensity if your heart starts beating faster and you start breathing faster but can still hold a conversation. You can tell that you are exercising at a vigorous intensity if you are breathing much harder and faster and cannot hold a conversation while exercising. Exercising regularly is important. It has many health benefits, such as:  Improving overall fitness, flexibility, and endurance.  Increasing bone density.  Helping with weight control.  Decreasing body fat.  Increasing muscle strength.  Reducing stress and tension.  Improving overall health. How often should I exercise? Choose an activity that you enjoy, and set realistic goals. Your health care provider can help you make an activity plan that works for you. Exercise regularly as told by your health care provider. This may include:  Doing strength training two times a week, such as: ? Lifting weights. ? Using resistance bands. ? Push-ups. ? Sit-ups. ? Yoga.  Doing a certain intensity of exercise for a given amount of time. Choose from these options: ? A total of 150 minutes of moderate-intensity exercise every week. ? A total of 75 minutes of vigorous-intensity exercise every week. ? A mix of moderate-intensity and vigorous-intensity exercise every week. Children, pregnant women, people who have not exercised regularly, people who are overweight, and older adults may need to talk with a health care provider about what activities are safe to do. If you have a medical condition, be sure to talk with your health care provider before you start a new exercise program. What are some exercise ideas? Moderate-intensity exercise ideas include:  Walking 1 mile (1.6 km) in about 15  minutes.  Biking.  Hiking.  Golfing.  Dancing.  Water aerobics. Vigorous-intensity exercise ideas include:  Walking 4.5 miles (7.2 km) or more in about 1 hour.  Jogging or running 5 miles (8 km) in about 1 hour.  Biking 10 miles (16.1 km) or more in about 1 hour.  Lap swimming.  Roller-skating or in-line skating.  Cross-country skiing.  Vigorous competitive sports, such as football, basketball, and soccer.  Jumping rope.  Aerobic dancing. What are some everyday activities that can help me to get exercise?  Yard work, such as: ? Pushing a lawn mower. ? Raking and bagging leaves.  Washing your car.  Pushing a stroller.  Shoveling snow.  Gardening.  Washing windows or floors. How can I be more active in my day-to-day activities?  Use stairs instead of an elevator.  Take a walk during your lunch break.  If you drive, park your car farther away from your work or school.  If you take public transportation, get off one stop early and walk the rest of the way.  Stand up or walk around during all of your indoor phone calls.  Get up, stretch, and walk around every 30 minutes throughout the day.  Enjoy exercise with a friend. Support to continue exercising will help you keep a regular routine of activity. What guidelines can I follow while exercising?  Before you start a new exercise program, talk with your health care provider.  Do not exercise so much that you hurt yourself, feel dizzy, or get very short of breath.  Wear comfortable clothes and wear shoes with good support.  Drink plenty of water while you exercise to prevent dehydration or heat stroke.  Work out until your breathing   and your heartbeat get faster. Where to find more information  [place] Department of Health and Human Services: www.hhs.gov  Centers for Disease Control and Prevention (CDC): www.cdc.gov Summary  Exercising regularly is important. It will improve your overall fitness,  flexibility, and endurance.  Regular exercise also will improve your overall health. It can help you control your weight, reduce stress, and improve your bone density.  Do not exercise so much that you hurt yourself, feel dizzy, or get very short of breath.  Before you start a new exercise program, talk with your health care provider. This information is not intended to replace advice given to you by your health care provider. Make sure you discuss any questions you have with your health care provider. Document Revised: 12/21/2016 Document Reviewed: 11/29/2016 Elsevier Patient Education  2020 Elsevier Inc.  

## 2019-10-13 LAB — THYROID PANEL WITH TSH
Free Thyroxine Index: 2.4 (ref 1.2–4.9)
T3 Uptake Ratio: 31 % (ref 24–39)
T4, Total: 7.9 ug/dL (ref 4.5–12.0)
TSH: 1.01 u[IU]/mL (ref 0.450–4.500)

## 2019-10-13 LAB — CBC WITH DIFFERENTIAL/PLATELET
Basophils Absolute: 0.1 10*3/uL (ref 0.0–0.2)
Basos: 1 %
EOS (ABSOLUTE): 0.1 10*3/uL (ref 0.0–0.4)
Eos: 2 %
Hematocrit: 39.1 % (ref 34.0–46.6)
Hemoglobin: 12.9 g/dL (ref 11.1–15.9)
Immature Grans (Abs): 0 10*3/uL (ref 0.0–0.1)
Immature Granulocytes: 0 %
Lymphocytes Absolute: 2 10*3/uL (ref 0.7–3.1)
Lymphs: 32 %
MCH: 31.5 pg (ref 26.6–33.0)
MCHC: 33 g/dL (ref 31.5–35.7)
MCV: 95 fL (ref 79–97)
Monocytes Absolute: 0.6 10*3/uL (ref 0.1–0.9)
Monocytes: 9 %
Neutrophils Absolute: 3.6 10*3/uL (ref 1.4–7.0)
Neutrophils: 56 %
Platelets: 216 10*3/uL (ref 150–450)
RBC: 4.1 x10E6/uL (ref 3.77–5.28)
RDW: 12.1 % (ref 11.7–15.4)
WBC: 6.4 10*3/uL (ref 3.4–10.8)

## 2019-10-13 LAB — CMP14+EGFR
ALT: 19 IU/L (ref 0–32)
AST: 22 IU/L (ref 0–40)
Albumin/Globulin Ratio: 1.7 (ref 1.2–2.2)
Albumin: 4.2 g/dL (ref 3.8–4.8)
Alkaline Phosphatase: 80 IU/L (ref 44–121)
BUN/Creatinine Ratio: 15 (ref 12–28)
BUN: 15 mg/dL (ref 8–27)
Bilirubin Total: 0.5 mg/dL (ref 0.0–1.2)
CO2: 25 mmol/L (ref 20–29)
Calcium: 9.4 mg/dL (ref 8.7–10.3)
Chloride: 103 mmol/L (ref 96–106)
Creatinine, Ser: 1 mg/dL (ref 0.57–1.00)
GFR calc Af Amer: 69 mL/min/{1.73_m2} (ref >59)
GFR calc non Af Amer: 60 mL/min/{1.73_m2} (ref >59)
Globulin, Total: 2.5 g/dL (ref 1.5–4.5)
Glucose: 87 mg/dL (ref 65–99)
Potassium: 4.3 mmol/L (ref 3.5–5.2)
Sodium: 142 mmol/L (ref 134–144)
Total Protein: 6.7 g/dL (ref 6.0–8.5)

## 2019-10-13 LAB — LIPID PANEL
Chol/HDL Ratio: 2.4 ratio (ref 0.0–4.4)
Cholesterol, Total: 127 mg/dL (ref 100–199)
HDL: 53 mg/dL (ref >39)
LDL Chol Calc (NIH): 61 mg/dL (ref 0–99)
Triglycerides: 59 mg/dL (ref 0–149)
VLDL Cholesterol Cal: 13 mg/dL (ref 5–40)

## 2019-11-24 ENCOUNTER — Encounter: Payer: Self-pay | Admitting: Nurse Practitioner

## 2020-03-10 ENCOUNTER — Other Ambulatory Visit: Payer: Self-pay | Admitting: Nurse Practitioner

## 2020-03-10 DIAGNOSIS — E039 Hypothyroidism, unspecified: Secondary | ICD-10-CM

## 2020-04-11 ENCOUNTER — Other Ambulatory Visit: Payer: Self-pay

## 2020-04-11 ENCOUNTER — Ambulatory Visit: Payer: Managed Care, Other (non HMO) | Admitting: Nurse Practitioner

## 2020-04-11 ENCOUNTER — Encounter: Payer: Self-pay | Admitting: Nurse Practitioner

## 2020-04-11 VITALS — BP 132/73 | HR 61 | Temp 96.6°F | Resp 20 | Ht 64.0 in | Wt 131.0 lb

## 2020-04-11 DIAGNOSIS — E039 Hypothyroidism, unspecified: Secondary | ICD-10-CM | POA: Diagnosis not present

## 2020-04-11 DIAGNOSIS — I10 Essential (primary) hypertension: Secondary | ICD-10-CM | POA: Diagnosis not present

## 2020-04-11 DIAGNOSIS — E785 Hyperlipidemia, unspecified: Secondary | ICD-10-CM | POA: Diagnosis not present

## 2020-04-11 DIAGNOSIS — Z6829 Body mass index (BMI) 29.0-29.9, adult: Secondary | ICD-10-CM | POA: Diagnosis not present

## 2020-04-11 MED ORDER — LISINOPRIL 10 MG PO TABS: 10.0000 mg | ORAL_TABLET | Freq: Every day | ORAL | 1 refills | Status: DC

## 2020-04-11 MED ORDER — ROSUVASTATIN CALCIUM 10 MG PO TABS: 10.0000 mg | ORAL_TABLET | Freq: Every day | ORAL | 1 refills | Status: DC

## 2020-04-11 MED ORDER — LEVOTHYROXINE SODIUM 75 MCG PO TABS: 75.0000 ug | ORAL_TABLET | Freq: Every day | ORAL | 1 refills | Status: DC

## 2020-04-11 NOTE — Addendum Note (Signed)
Addended by: Bennie Pierini on: 04/11/2020 09:10 AM   Modules accepted: Orders

## 2020-04-11 NOTE — Patient Instructions (Signed)
Exercising to Stay Healthy To become healthy and stay healthy, it is recommended that you do moderate-intensity and vigorous-intensity exercise. You can tell that you are exercising at a moderate intensity if your heart starts beating faster and you start breathing faster but can still hold a conversation. You can tell that you are exercising at a vigorous intensity if you are breathing much harder and faster and cannot hold a conversation while exercising. Exercising regularly is important. It has many health benefits, such as:  Improving overall fitness, flexibility, and endurance.  Increasing bone density.  Helping with weight control.  Decreasing body fat.  Increasing muscle strength.  Reducing stress and tension.  Improving overall health. How often should I exercise? Choose an activity that you enjoy, and set realistic goals. Your health care provider can help you make an activity plan that works for you. Exercise regularly as told by your health care provider. This may include:  Doing strength training two times a week, such as: ? Lifting weights. ? Using resistance bands. ? Push-ups. ? Sit-ups. ? Yoga.  Doing a certain intensity of exercise for a given amount of time. Choose from these options: ? A total of 150 minutes of moderate-intensity exercise every week. ? A total of 75 minutes of vigorous-intensity exercise every week. ? A mix of moderate-intensity and vigorous-intensity exercise every week. Children, pregnant women, people who have not exercised regularly, people who are overweight, and older adults may need to talk with a health care provider about what activities are safe to do. If you have a medical condition, be sure to talk with your health care provider before you start a new exercise program. What are some exercise ideas? Moderate-intensity exercise ideas include:  Walking 1 mile (1.6 km) in about 15  minutes.  Biking.  Hiking.  Golfing.  Dancing.  Water aerobics. Vigorous-intensity exercise ideas include:  Walking 4.5 miles (7.2 km) or more in about 1 hour.  Jogging or running 5 miles (8 km) in about 1 hour.  Biking 10 miles (16.1 km) or more in about 1 hour.  Lap swimming.  Roller-skating or in-line skating.  Cross-country skiing.  Vigorous competitive sports, such as football, basketball, and soccer.  Jumping rope.  Aerobic dancing.   What are some everyday activities that can help me to get exercise?  Yard work, such as: ? Pushing a lawn mower. ? Raking and bagging leaves.  Washing your car.  Pushing a stroller.  Shoveling snow.  Gardening.  Washing windows or floors. How can I be more active in my day-to-day activities?  Use stairs instead of an elevator.  Take a walk during your lunch break.  If you drive, park your car farther away from your work or school.  If you take public transportation, get off one stop early and walk the rest of the way.  Stand up or walk around during all of your indoor phone calls.  Get up, stretch, and walk around every 30 minutes throughout the day.  Enjoy exercise with a friend. Support to continue exercising will help you keep a regular routine of activity. What guidelines can I follow while exercising?  Before you start a new exercise program, talk with your health care provider.  Do not exercise so much that you hurt yourself, feel dizzy, or get very short of breath.  Wear comfortable clothes and wear shoes with good support.  Drink plenty of water while you exercise to prevent dehydration or heat stroke.  Work out until   your breathing and your heartbeat get faster. Where to find more information  [place] Department of Health and Human Services: www.hhs.gov  Centers for Disease Control and Prevention (CDC): www.cdc.gov Summary  Exercising regularly is important. It will improve your overall fitness,  flexibility, and endurance.  Regular exercise also will improve your overall health. It can help you control your weight, reduce stress, and improve your bone density.  Do not exercise so much that you hurt yourself, feel dizzy, or get very short of breath.  Before you start a new exercise program, talk with your health care provider. This information is not intended to replace advice given to you by your health care provider. Make sure you discuss any questions you have with your health care provider. Document Revised: 12/21/2016 Document Reviewed: 11/29/2016 Elsevier Patient Education  2021 Elsevier Inc.  

## 2020-04-11 NOTE — Progress Notes (Signed)
Subjective:    Patient ID: Teresa Ayala, female    DOB: December 23, 1955, 65 y.o.   MRN: [id number]   Chief Complaint: Medical Management of Chronic Issues    HPI:  1. Primary hypertension No c/o chest pain, sob or headache. Doe snot check blood pressure at home,. BP Readings from Last 3 Encounters:  04/11/20 132/73  10/12/19 124/64  07/30/19 139/71     2. Hyperlipidemia with target LDL less than 100 Does try to watch diet and she walks everyday. Lab Results  Component Value Date   CHOL 127 10/12/2019   HDL 53 10/12/2019   LDLCALC 61 10/12/2019   TRIG 59 10/12/2019   CHOLHDL 2.4 10/12/2019     3. Acquired hypothyroidism No problems that she is aware of. Lab Results  Component Value Date   TSH 1.010 10/12/2019     4. BMI 29.0-29.9,adult Weight is down 3 lbs Wt Readings from Last 3 Encounters:  04/11/20 131 lb (59.4 kg)  10/12/19 134 lb (60.8 kg)  07/30/19 140 lb 3.2 oz (63.6 kg)   BMI Readings from Last 3 Encounters:  04/11/20 22.49 kg/m  10/12/19 23.00 kg/m  07/30/19 24.07 kg/m       Outpatient Encounter Medications as of 04/11/2020  Medication Sig  . levothyroxine (SYNTHROID) 75 MCG tablet TAKE 1 TABLET BY MOUTH EVERY DAY  . lisinopril (ZESTRIL) 10 MG tablet Take 1 tablet (10 mg total) by mouth daily.  . rosuvastatin (CRESTOR) 10 MG tablet Take 1 tablet (10 mg total) by mouth daily.   No facility-administered encounter medications on file as of 04/11/2020.    Past Surgical History:  Procedure Laterality Date  . CHOLECYSTECTOMY  2000  . EXTRACORPOREAL SHOCK WAVE LITHOTRIPSY Left 07/30/2019   Procedure: LEFT EXTRACORPOREAL SHOCK WAVE LITHOTRIPSY (ESWL);  Surgeon: Sebastian Ache, MD;  Location: [facility];  Service: Urology;  Laterality: Left;    Family History  Problem Relation Age of Onset  . Cancer Father        LUNG  . Colon cancer Neg Hx     New complaints: None today  Social history: Lives with her husband and helps take  care of her mom  Controlled substance contract: n/a    Review of Systems  Constitutional: Negative for diaphoresis.  Eyes: Negative for pain.  Respiratory: Negative for shortness of breath.   Cardiovascular: Negative for chest pain, palpitations and leg swelling.  Gastrointestinal: Negative for abdominal pain.  Endocrine: Negative for polydipsia.  Skin: Negative for rash.  Neurological: Negative for dizziness, weakness and headaches.  Hematological: Does not bruise/bleed easily.  All other systems reviewed and are negative.      Objective:   Physical Exam Vitals and nursing note reviewed.  Constitutional:      General: She is not in acute distress.    Appearance: Normal appearance. She is well-developed.  HENT:     Head: Normocephalic.     Nose: Nose normal.  Eyes:     Pupils: Pupils are equal, round, and reactive to light.  Neck:     Vascular: No carotid bruit or JVD.  Cardiovascular:     Rate and Rhythm: Normal rate and regular rhythm.     Heart sounds: Normal heart sounds.  Pulmonary:     Effort: Pulmonary effort is normal. No respiratory distress.     Breath sounds: Normal breath sounds. No wheezing or rales.  Chest:     Chest wall: No tenderness.  Abdominal:     General: Bowel  sounds are normal. There is no distension or abdominal bruit.     Palpations: Abdomen is soft. There is no hepatomegaly, splenomegaly, mass or pulsatile mass.     Tenderness: There is no abdominal tenderness.  Musculoskeletal:        General: Normal range of motion.     Cervical back: Normal range of motion and neck supple.  Lymphadenopathy:     Cervical: No cervical adenopathy.  Skin:    General: Skin is warm and dry.  Neurological:     Mental Status: She is alert and oriented to person, place, and time.     Deep Tendon Reflexes: Reflexes are normal and symmetric.  Psychiatric:        Behavior: Behavior normal.        Thought Content: Thought content normal.        Judgment:  Judgment normal.     BP 132/73   Pulse 61   Temp (!) 96.6 F (35.9 C) (Temporal)   Resp 20   Ht 5\' 4"  (1.626 m)   Wt 131 lb (59.4 kg)   SpO2 99%   BMI 22.49 kg/m        Assessment & Plan:  Teresa Ayala comes in today with chief complaint of Medical Management of Chronic Issues   Diagnosis and orders addressed:  1. Primary hypertension Low sodium diet - lisinopril (ZESTRIL) 10 MG tablet; Take 1 tablet (10 mg total) by mouth daily.  Dispense: 90 tablet; Refill: 1  2. Hyperlipidemia with target LDL less than 100 Low fat diet - rosuvastatin (CRESTOR) 10 MG tablet; Take 1 tablet (10 mg total) by mouth daily.  Dispense: 90 tablet; Refill: 1  3. Acquired hypothyroidism Labs pending - levothyroxine (SYNTHROID) 75 MCG tablet; Take 1 tablet (75 mcg total) by mouth daily.  Dispense: 90 tablet; Refill: 1  4. BMI 29.0-29.9,adult Discussed diet and exercise for person with BMI >25 Will recheck weight in 3-6 months    Labs pending Health Maintenance reviewed Diet and exercise encouraged  Follow up plan: 6 months   Mary-Margaret 10-20-1984, FNP

## 2020-04-12 LAB — CBC WITH DIFFERENTIAL/PLATELET
Basophils Absolute: 0.1 10*3/uL (ref 0.0–0.2)
Basos: 1 %
EOS (ABSOLUTE): 0.1 10*3/uL (ref 0.0–0.4)
Eos: 1 %
Hematocrit: 41.1 % (ref 34.0–46.6)
Hemoglobin: 13.6 g/dL (ref 11.1–15.9)
Immature Grans (Abs): 0 10*3/uL (ref 0.0–0.1)
Immature Granulocytes: 0 %
Lymphocytes Absolute: 2.2 10*3/uL (ref 0.7–3.1)
Lymphs: 30 %
MCH: 31.6 pg (ref 26.6–33.0)
MCHC: 33.1 g/dL (ref 31.5–35.7)
MCV: 95 fL (ref 79–97)
Monocytes Absolute: 0.6 10*3/uL (ref 0.1–0.9)
Monocytes: 8 %
Neutrophils Absolute: 4.5 10*3/uL (ref 1.4–7.0)
Neutrophils: 60 %
Platelets: 223 10*3/uL (ref 150–450)
RBC: 4.31 x10E6/uL (ref 3.77–5.28)
RDW: 12.1 % (ref 11.7–15.4)
WBC: 7.4 10*3/uL (ref 3.4–10.8)

## 2020-04-12 LAB — CMP14+EGFR
ALT: 12 IU/L (ref 0–32)
AST: 22 IU/L (ref 0–40)
Albumin/Globulin Ratio: 1.9 (ref 1.2–2.2)
Albumin: 4.6 g/dL (ref 3.8–4.8)
Alkaline Phosphatase: 79 IU/L (ref 44–121)
BUN/Creatinine Ratio: 17 (ref 12–28)
BUN: 15 mg/dL (ref 8–27)
Bilirubin Total: 0.5 mg/dL (ref 0.0–1.2)
CO2: 25 mmol/L (ref 20–29)
Calcium: 9.3 mg/dL (ref 8.7–10.3)
Chloride: 103 mmol/L (ref 96–106)
Creatinine, Ser: 0.88 mg/dL (ref 0.57–1.00)
Globulin, Total: 2.4 g/dL (ref 1.5–4.5)
Glucose: 100 mg/dL — ABNORMAL HIGH (ref 65–99)
Potassium: 4.7 mmol/L (ref 3.5–5.2)
Sodium: 142 mmol/L (ref 134–144)
Total Protein: 7 g/dL (ref 6.0–8.5)
eGFR: 74 mL/min/{1.73_m2} (ref >59)

## 2020-04-12 LAB — LIPID PANEL
Chol/HDL Ratio: 2.1 ratio (ref 0.0–4.4)
Cholesterol, Total: 126 mg/dL (ref 100–199)
HDL: 59 mg/dL (ref >39)
LDL Chol Calc (NIH): 51 mg/dL (ref 0–99)
Triglycerides: 80 mg/dL (ref 0–149)
VLDL Cholesterol Cal: 16 mg/dL (ref 5–40)

## 2020-04-12 LAB — THYROID PANEL WITH TSH
Free Thyroxine Index: 2.7 (ref 1.2–4.9)
T3 Uptake Ratio: 32 % (ref 24–39)
T4, Total: 8.4 ug/dL (ref 4.5–12.0)
TSH: 3.07 u[IU]/mL (ref 0.450–4.500)

## 2020-08-19 ENCOUNTER — Encounter: Payer: Self-pay | Admitting: Nurse Practitioner

## 2020-08-19 ENCOUNTER — Telehealth (INDEPENDENT_AMBULATORY_CARE_PROVIDER_SITE_OTHER): Payer: Managed Care, Other (non HMO) | Admitting: Nurse Practitioner

## 2020-08-19 DIAGNOSIS — R5081 Fever presenting with conditions classified elsewhere: Secondary | ICD-10-CM

## 2020-08-19 DIAGNOSIS — R519 Headache, unspecified: Secondary | ICD-10-CM | POA: Diagnosis not present

## 2020-08-19 DIAGNOSIS — R52 Pain, unspecified: Secondary | ICD-10-CM

## 2020-08-19 NOTE — Progress Notes (Addendum)
Virtual Visit  Note Due to COVID-19 pandemic this visit was conducted virtually. This visit type was conducted due to national recommendations for restrictions regarding the COVID-19 Pandemic (e.g. social distancing, sheltering in place) in an effort to limit this patient's exposure and mitigate transmission in our community. All issues noted in this document were discussed and addressed.  A physical exam was not performed with this format.  I connected with Teresa Ayala on 08/19/20 at 12:35 by video and verified that I am speaking with the correct person using two identifiers. Teresa Ayala is currently located at work and no one is currently with her during visit. The provider, Mary-Margaret Daphine Deutscher, FNP is located in their office at time of visit.  I discussed the limitations, risks, security and privacy concerns of performing an evaluation and management service by telephone and the availability of in person appointments. I also discussed with the patient that there may be a patient responsible charge related to this service. The patient expressed understanding and agreed to proceed.   History and Present Illness:   Chief Complaint: Fever   HPI Patient does video visit  today stating that for the last 2 weeks she has had a headache. Felt like her right eye was being pulled out of her head. Her boss son had covid but she did not think that is what that was. So last Saturday she developed diarrhea that lasted 2 days. Monday she developed low back pain. She went to see her urologist and they did CT scan which was negative, no UTI either. Wednesday morning she broke out u a sweat in middle of night. Tested for covid next morning but was negative. Now she feels fine.     Review of Systems  Constitutional:  Negative for diaphoresis and weight loss.  Eyes:  Negative for blurred vision, double vision and pain.  Respiratory:  Negative for shortness of breath.   Cardiovascular:  Negative for chest pain,  palpitations, orthopnea and leg swelling.  Gastrointestinal:  Negative for abdominal pain.  Skin:  Negative for rash.  Neurological:  Negative for dizziness, sensory change, loss of consciousness, weakness and headaches.  Endo/Heme/Allergies:  Negative for polydipsia. Does not bruise/bleed easily.  Psychiatric/Behavioral:  Negative for memory loss. The patient does not have insomnia.   All other systems reviewed and are negative.   Observations/Objective: Alert and oriented- answers all questions appropriately No distress   Assessment and Plan: Teresa Ayala in today with chief complaint of Fever   1. Fever in other diseases resolved  2. Body aches resolved  3. Acute nonintractable headache, unspecified headache type resolved  I feel like she may have had covid but waited to late to test Since she is feeling better we will just wtach her for now Call if any symptoms return.   Follow Up Instructions: prn    I discussed the assessment and treatment plan with the patient. The patient was provided an opportunity to ask questions and all were answered. The patient agreed with the plan and demonstrated an understanding of the instructions.   The patient was advised to call back or seek an in-person evaluation if the symptoms worsen or if the condition fails to improve as anticipated.  The above assessment and management plan was discussed with the patient. The patient verbalized understanding of and has agreed to the management plan. Patient is aware to call the clinic if symptoms persist or worsen. Patient is aware when to return to the clinic for a follow-up  visit. Patient educated on when it is appropriate to go to the emergency department.   Time call ended:  12:55  I provided 20 minutes of  non face-to-face time during this encounter.    Mary-Margaret Daphine Deutscher, FNP

## 2020-09-09 ENCOUNTER — Encounter: Payer: Self-pay | Admitting: Physician Assistant

## 2020-09-09 ENCOUNTER — Telehealth: Payer: Managed Care, Other (non HMO) | Admitting: Physician Assistant

## 2020-09-09 DIAGNOSIS — L247 Irritant contact dermatitis due to plants, except food: Secondary | ICD-10-CM | POA: Diagnosis not present

## 2020-09-09 MED ORDER — PREDNISONE 10 MG PO TABS
ORAL_TABLET | ORAL | 0 refills | Status: DC
Start: 2020-09-09 — End: 2020-10-13

## 2020-09-09 NOTE — Progress Notes (Signed)
Ms. camiya, vinal are scheduled for a virtual visit with your provider today.    Just as we do with appointments in the office, we must obtain your consent to participate.  Your consent will be active for this visit and any virtual visit you may have with one of our providers in the next 365 days.    If you have a MyChart account, I can also send a copy of this consent to you electronically.  All virtual visits are billed to your insurance company just like a traditional visit in the office.  As this is a virtual visit, video technology does not allow for your provider to perform a traditional examination.  This may limit your provider's ability to fully assess your condition.  If your provider identifies any concerns that need to be evaluated in person or the need to arrange testing such as labs, EKG, etc, we will make arrangements to do so.    Although advances in technology are sophisticated, we cannot ensure that it will always work on either your end or our end.  If the connection with a video visit is poor, we may have to switch to a telephone visit.  With either a video or telephone visit, we are not always able to ensure that we have a secure connection.   I need to obtain your verbal consent now.   Are you willing to proceed with your visit today?   Tashawn Greff has provided verbal consent on 09/09/2020 for a virtual visit (video or telephone).   Andy Harjit Leider, PA-C 09/09/2020  11:00 AM   Date:  09/09/2020   ID:  Loraine Maple, DOB 1955-09-12, MRN [id number]  Patient Location: Home Provider Location: Home Office   Participants: Patient and Provider for Visit and Wrap up  Method of visit: Video  Location of Patient: Home Location of Provider: Home Office Consent was obtain for visit over the video. Services rendered by provider: Visit was performed via video  A video enabled telemedicine application was used and I verified that I am speaking with the correct person using two  identifiers.  PCP:  Bennie Pierini, FNP   Chief Complaint:  rash  History of Present Illness:    Terina Mcelhinny is a 65 y.o. female with history as stated below. Presents video telehealth for an acute care visit.  Pt reports poison oak rash on the right wrist with a known exposure on Sunday Aug 7th exposure and rash started Tuesday Aug 9th. Pt reports this happens yearly when she clears brush at her mothers home.   On Thursday Aug 11th pt had a teledoc appointment with her employee health benefits and was prescribed Prednisone 20mg  x5 days, hydroxyzine 25mg  PRN and  triamcinolone 0.1%.    Pt reports initial improvement of the rash with prednisone, but rash worsened after the end of the prednisone course.  Hydroxyzine makes her very sleepy therefore she stopped taking this and does not feel the triamcinolone is making enough of a difference.   Reports the rash is itchy and uncomfortable but does not appear infected.   No fevers, chills, secondary infection.   No other aggravating or relieving factors.  No other c/o.  Past Medical, Surgical, Social History, Allergies, and Medications have been Reviewed.  Patient Active Problem List   Diagnosis Date Noted   BMI 29.0-29.9,adult 09/30/2014   Hypertension 11/24/2012   Hyperlipidemia with target LDL less than 100 11/24/2012   Hypothyroidism 11/24/2012    Social History   Tobacco Use  Smoking status: Never   Smokeless tobacco: Never  Substance Use Topics   Alcohol use: Yes    Comment: drinks on weekends     Current Outpatient Medications:    predniSONE (DELTASONE) 10 MG tablet, Days 1-4 take 4 tablets (40 mg) daily  Days 5-8 take 3 tablets (30 mg) daily, Days 9-11 take 2 tablets (20 mg) daily, Days 12-14 take 1 tablet (10 mg) daily, Disp: 37 tablet, Rfl: 0   levothyroxine (SYNTHROID) 75 MCG tablet, Take 1 tablet (75 mcg total) by mouth daily., Disp: 90 tablet, Rfl: 1   lisinopril (ZESTRIL) 10 MG tablet, Take 1 tablet (10 mg  total) by mouth daily., Disp: 90 tablet, Rfl: 1   rosuvastatin (CRESTOR) 10 MG tablet, Take 1 tablet (10 mg total) by mouth daily., Disp: 90 tablet, Rfl: 1   No Known Allergies   Review of Systems  Constitutional:  Negative for chills and fever.  HENT:  Negative for congestion, ear pain and sore throat.   Eyes:  Negative for blurred vision and double vision.  Respiratory:  Negative for cough, shortness of breath and wheezing.   Cardiovascular:  Negative for chest pain, palpitations and leg swelling.  Gastrointestinal:  Negative for abdominal pain, diarrhea, nausea and vomiting.  Genitourinary:  Negative for dysuria.  Musculoskeletal:  Negative for myalgias.  Skin:  Positive for rash.  Neurological:  Negative for loss of consciousness, weakness and headaches.  Psychiatric/Behavioral:  The patient is not nervous/anxious.   See HPI for history of present illness.  Physical Exam Constitutional:      General: She is not in acute distress.    Appearance: Normal appearance. She is not ill-appearing.  HENT:     Head: Normocephalic and atraumatic.     Nose: No congestion.  Eyes:     Extraocular Movements: Extraocular movements intact.  Pulmonary:     Effort: Pulmonary effort is normal.  Musculoskeletal:        General: Normal range of motion.     Cervical back: Normal range of motion.  Skin:    Coloration: Skin is not pale.     Comments: Erythematous, vesicular rash to the right wrist and forearm. No obvious signs of secondary infection.  Neurological:     General: No focal deficit present.     Mental Status: She is alert. Mental status is at baseline.  Psychiatric:        Mood and Affect: Mood normal.              A&P  1. Irritant contact dermatitis due to plants, except food  - start prednisone taper  - stop Hydroxyzine  - start Zyrtec 10mg  2x per day for 10 days  - Use Zanfel OTC to remove the oil  - Continue using Triamcinolone  - keep rash clean with warm soap and  water  - face to face visit for worsening symptoms    Patient voiced understanding and agreement to plan.   Time:   Today, I have spent 15 minutes with the patient with telehealth technology discussing the above problems, reviewing the chart, previous notes, medications and orders.    Tests Ordered: No orders of the defined types were placed in this encounter.   Medication Changes: Meds ordered this encounter  Medications   predniSONE (DELTASONE) 10 MG tablet    Sig: Days 1-4 take 4 tablets (40 mg) daily  Days 5-8 take 3 tablets (30 mg) daily, Days 9-11 take 2 tablets (20 mg) daily, Days 12-14  take 1 tablet (10 mg) daily    Dispense:  37 tablet    Refill:  0     Disposition:  Follow up PCP or urgent care as needed   SignedMarthe, Dant, PA-C  09/09/2020 11:13 AM

## 2020-09-09 NOTE — Patient Instructions (Signed)
1. Irritant contact dermatitis due to plants, except food  - start prednisone taper  - stop Hydroxyzine  - start Zyrtec 10mg  2x per day for 10 days  - Use Zanfel OTC to remove the oil  - Continue using Triamcinolone  - keep rash clean with warm soap and water  - face to face visit for worsening symptoms

## 2020-10-13 ENCOUNTER — Ambulatory Visit (INDEPENDENT_AMBULATORY_CARE_PROVIDER_SITE_OTHER): Payer: Managed Care, Other (non HMO) | Admitting: Nurse Practitioner

## 2020-10-13 ENCOUNTER — Encounter: Payer: Self-pay | Admitting: Nurse Practitioner

## 2020-10-13 ENCOUNTER — Other Ambulatory Visit: Payer: Self-pay

## 2020-10-13 VITALS — BP 114/65 | HR 71 | Temp 97.8°F | Resp 20 | Ht 64.0 in | Wt 132.0 lb

## 2020-10-13 DIAGNOSIS — Z23 Encounter for immunization: Secondary | ICD-10-CM | POA: Diagnosis not present

## 2020-10-13 DIAGNOSIS — E785 Hyperlipidemia, unspecified: Secondary | ICD-10-CM

## 2020-10-13 DIAGNOSIS — I10 Essential (primary) hypertension: Secondary | ICD-10-CM

## 2020-10-13 DIAGNOSIS — E039 Hypothyroidism, unspecified: Secondary | ICD-10-CM

## 2020-10-13 DIAGNOSIS — Z6829 Body mass index (BMI) 29.0-29.9, adult: Secondary | ICD-10-CM

## 2020-10-13 NOTE — Patient Instructions (Signed)
Exercising to Stay Healthy °To become healthy and stay healthy, it is recommended that you do moderate-intensity and vigorous-intensity exercise. You can tell that you are exercising at a moderate intensity if your heart starts beating faster and you start breathing faster but can still hold a conversation. You can tell that you are exercising at a vigorous intensity if you are breathing much harder and faster and cannot hold a conversation while exercising. °How can exercise benefit me? °Exercising regularly is important. It has many health benefits, such as: °Improving overall fitness, flexibility, and endurance. °Increasing bone density. °Helping with weight control. °Decreasing body fat. °Increasing muscle strength and endurance. °Reducing stress and tension, anxiety, depression, or anger. °Improving overall health. °What guidelines should I follow while exercising? °Before you start a new exercise program, talk with your health care provider. °Do not exercise so much that you hurt yourself, feel dizzy, or get very short of breath. °Wear comfortable clothes and wear shoes with good support. °Drink plenty of water while you exercise to prevent dehydration or heat stroke. °Work out until your breathing and your heartbeat get faster (moderate intensity). °How often should I exercise? °Choose an activity that you enjoy, and set realistic goals. Your health care provider can help you make an activity plan that is individually designed and works best for you. °Exercise regularly as told by your health care provider. This may include: °Doing strength training two times a week, such as: °Lifting weights. °Using resistance bands. °Push-ups. °Sit-ups. °Yoga. °Doing a certain intensity of exercise for a given amount of time. Choose from these options: °A total of 150 minutes of moderate-intensity exercise every week. °A total of 75 minutes of vigorous-intensity exercise every week. °A mix of moderate-intensity and  vigorous-intensity exercise every week. °Children, pregnant women, people who have not exercised regularly, people who are overweight, and older adults may need to talk with a health care provider about what activities are safe to perform. If you have a medical condition, be sure to talk with your health care provider before you start a new exercise program. °What are some exercise ideas? °Moderate-intensity exercise ideas include: °Walking 1 mile (1.6 km) in about 15 minutes. °Biking. °Hiking. °Golfing. °Dancing. °Water aerobics. °Vigorous-intensity exercise ideas include: °Walking 4.5 miles (7.2 km) or more in about 1 hour. °Jogging or running 5 miles (8 km) in about 1 hour. °Biking 10 miles (16.1 km) or more in about 1 hour. °Lap swimming. °Roller-skating or in-line skating. °Cross-country skiing. °Vigorous competitive sports, such as football, basketball, and soccer. °Jumping rope. °Aerobic dancing. °What are some everyday activities that can help me get exercise? °Yard work, such as: °Pushing a lawn mower. °Raking and bagging leaves. °Washing your car. °Pushing a stroller. °Shoveling snow. °Gardening. °Washing windows or floors. °How can I be more active in my day-to-day activities? °Use stairs instead of an elevator. °Take a walk during your lunch break. °If you drive, park your car farther away from your work or school. °If you take public transportation, get off one stop early and walk the rest of the way. °Stand up or walk around during all of your indoor phone calls. °Get up, stretch, and walk around every 30 minutes throughout the day. °Enjoy exercise with a friend. Support to continue exercising will help you keep a regular routine of activity. °Where to find more information °You can find more information about exercising to stay healthy from: °[place] Department of Health and Human Services: www.hhs.gov °Centers for Disease Control and Prevention (  CDC): www.cdc.gov °Summary °Exercising regularly is  important. It will improve your overall fitness, flexibility, and endurance. °Regular exercise will also improve your overall health. It can help you control your weight, reduce stress, and improve your bone density. °Do not exercise so much that you hurt yourself, feel dizzy, or get very short of breath. °Before you start a new exercise program, talk with your health care provider. °This information is not intended to replace advice given to you by your health care provider. Make sure you discuss any questions you have with your health care provider. °Document Revised: 05/06/2020 Document Reviewed: 05/06/2020 °Elsevier Patient Education © 2022 Elsevier Inc. ° °

## 2020-10-13 NOTE — Progress Notes (Signed)
Subjective:    Patient ID: Teresa Ayala, female    DOB: August 29, 1955, 65 y.o.   MRN: [id number]   Chief Complaint: medical management of chronic issues     HPI:  1. Primary hypertension No c/o chest pain, sob or headache. Does not check blood pressure at home. BP Readings from Last 3 Encounters:  04/11/20 132/73  10/12/19 124/64  07/30/19 139/71     2. Hyperlipidemia with target LDL less than 100 Does try to watch diet. Walks daily Lab Results  Component Value Date   CHOL 126 04/11/2020   HDL 59 04/11/2020   LDLCALC 51 04/11/2020   TRIG 80 04/11/2020   CHOLHDL 2.1 04/11/2020     3. Acquired hypothyroidism No problems that she is aware of. Lab Results  Component Value Date   TSH 3.070 04/11/2020     4. BMI 29.0-29.9,adult No recent weight changes  Wt Readings from Last 3 Encounters:  10/13/20 132 lb (59.9 kg)  04/11/20 131 lb (59.4 kg)  10/12/19 134 lb (60.8 kg)   BMI Readings from Last 3 Encounters:  10/13/20 22.66 kg/m  04/11/20 22.49 kg/m  10/12/19 23.00 kg/m      Outpatient Encounter Medications as of 10/13/2020  Medication Sig   levothyroxine (SYNTHROID) 75 MCG tablet Take 1 tablet (75 mcg total) by mouth daily.   lisinopril (ZESTRIL) 10 MG tablet Take 1 tablet (10 mg total) by mouth daily.   predniSONE (DELTASONE) 10 MG tablet Days 1-4 take 4 tablets (40 mg) daily  Days 5-8 take 3 tablets (30 mg) daily, Days 9-11 take 2 tablets (20 mg) daily, Days 12-14 take 1 tablet (10 mg) daily   rosuvastatin (CRESTOR) 10 MG tablet Take 1 tablet (10 mg total) by mouth daily.   No facility-administered encounter medications on file as of 10/13/2020.    Past Surgical History:  Procedure Laterality Date   CHOLECYSTECTOMY  2000   EXTRACORPOREAL SHOCK WAVE LITHOTRIPSY Left 07/30/2019   Procedure: LEFT EXTRACORPOREAL SHOCK WAVE LITHOTRIPSY (ESWL);  Surgeon: Alexis Frock, MD;  Location: [facility];  Service: Urology;  Laterality: Left;    Family  History  Problem Relation Age of Onset   Cancer Father        LUNG   Colon cancer Neg Hx     New complaints: None today  Social history: Lives with her husband  Controlled substance contract: n/a     Review of Systems  Constitutional:  Negative for diaphoresis.  Eyes:  Negative for pain.  Respiratory:  Negative for shortness of breath.   Cardiovascular:  Negative for chest pain, palpitations and leg swelling.  Gastrointestinal:  Negative for abdominal pain.  Endocrine: Negative for polydipsia.  Skin:  Negative for rash.  Neurological:  Negative for dizziness, weakness and headaches.  Hematological:  Does not bruise/bleed easily.  All other systems reviewed and are negative.     Objective:   Physical Exam Vitals and nursing note reviewed.  Constitutional:      General: She is not in acute distress.    Appearance: Normal appearance. She is well-developed.  HENT:     Head: Normocephalic.     Right Ear: Tympanic membrane normal.     Left Ear: Tympanic membrane normal.     Nose: Nose normal.     Mouth/Throat:     Mouth: Mucous membranes are moist.  Eyes:     Pupils: Pupils are equal, round, and reactive to light.  Neck:     Vascular: No carotid  bruit or JVD.  Cardiovascular:     Rate and Rhythm: Normal rate and regular rhythm.     Heart sounds: Normal heart sounds.  Pulmonary:     Effort: Pulmonary effort is normal. No respiratory distress.     Breath sounds: Normal breath sounds. No wheezing or rales.  Chest:     Chest wall: No tenderness.  Abdominal:     General: Bowel sounds are normal. There is no distension or abdominal bruit.     Palpations: Abdomen is soft. There is no hepatomegaly, splenomegaly, mass or pulsatile mass.     Tenderness: There is no abdominal tenderness.  Musculoskeletal:        General: Normal range of motion.     Cervical back: Normal range of motion and neck supple.  Lymphadenopathy:     Cervical: No cervical adenopathy.  Skin:     General: Skin is warm and dry.  Neurological:     Mental Status: She is alert and oriented to person, place, and time.     Deep Tendon Reflexes: Reflexes are normal and symmetric.  Psychiatric:        Behavior: Behavior normal.        Thought Content: Thought content normal.        Judgment: Judgment normal.   BP 114/65   Pulse 71   Temp 97.8 F (36.6 C)   Resp 20   Ht _0  (1.626 m)   Wt 132 lb (59.9 kg)   SpO2 100%   BMI 22.66 kg/m         Assessment & Plan:  Meda Dudzinski comes in today with chief complaint of Medical Management of Chronic Issues   Diagnosis and orders addressed:  1. Primary hypertension Low sodium diet - CBC with Differential/Platelet - CMP14+EGFR  2. Hyperlipidemia with target LDL less than 100 Low fat diet - Lipid panel  3. Acquired hypothyroidism Labs pending - Thyroid Panel With TSH  4. BMI 29.0-29.9,adult Discussed diet and exercise for person with BMI >25 Will recheck weight in 3-6 months    Labs pending Health Maintenance reviewed Diet and exercise encouraged  Follow up plan: 6 months   Mary-Margaret Hassell Done, FNP

## 2020-10-14 LAB — CBC WITH DIFFERENTIAL/PLATELET
Basophils Absolute: 0 10*3/uL (ref 0.0–0.2)
Basos: 1 %
EOS (ABSOLUTE): 0.1 10*3/uL (ref 0.0–0.4)
Eos: 2 %
Hematocrit: 38.6 % (ref 34.0–46.6)
Hemoglobin: 12.6 g/dL (ref 11.1–15.9)
Immature Grans (Abs): 0 10*3/uL (ref 0.0–0.1)
Immature Granulocytes: 0 %
Lymphocytes Absolute: 2.1 10*3/uL (ref 0.7–3.1)
Lymphs: 39 %
MCH: 30.9 pg (ref 26.6–33.0)
MCHC: 32.6 g/dL (ref 31.5–35.7)
MCV: 95 fL (ref 79–97)
Monocytes Absolute: 0.4 10*3/uL (ref 0.1–0.9)
Monocytes: 7 %
Neutrophils Absolute: 2.7 10*3/uL (ref 1.4–7.0)
Neutrophils: 51 %
Platelets: 237 10*3/uL (ref 150–450)
RBC: 4.08 x10E6/uL (ref 3.77–5.28)
RDW: 12 % (ref 11.7–15.4)
WBC: 5.2 10*3/uL (ref 3.4–10.8)

## 2020-10-14 LAB — CMP14+EGFR
ALT: 13 IU/L (ref 0–32)
AST: 23 IU/L (ref 0–40)
Albumin/Globulin Ratio: 1.5 (ref 1.2–2.2)
Albumin: 4 g/dL (ref 3.8–4.8)
Alkaline Phosphatase: 71 IU/L (ref 44–121)
BUN/Creatinine Ratio: 15 (ref 12–28)
BUN: 13 mg/dL (ref 8–27)
Bilirubin Total: 0.4 mg/dL (ref 0.0–1.2)
CO2: 22 mmol/L (ref 20–29)
Calcium: 8.9 mg/dL (ref 8.7–10.3)
Chloride: 106 mmol/L (ref 96–106)
Creatinine, Ser: 0.86 mg/dL (ref 0.57–1.00)
Globulin, Total: 2.6 g/dL (ref 1.5–4.5)
Glucose: 93 mg/dL (ref 65–99)
Potassium: 4.4 mmol/L (ref 3.5–5.2)
Sodium: 142 mmol/L (ref 134–144)
Total Protein: 6.6 g/dL (ref 6.0–8.5)
eGFR: 75 mL/min/{1.73_m2} (ref >59)

## 2020-10-14 LAB — LIPID PANEL
Chol/HDL Ratio: 2.4 ratio (ref 0.0–4.4)
Cholesterol, Total: 126 mg/dL (ref 100–199)
HDL: 52 mg/dL (ref >39)
LDL Chol Calc (NIH): 58 mg/dL (ref 0–99)
Triglycerides: 78 mg/dL (ref 0–149)
VLDL Cholesterol Cal: 16 mg/dL (ref 5–40)

## 2020-10-14 LAB — THYROID PANEL WITH TSH
Free Thyroxine Index: 2.3 (ref 1.2–4.9)
T3 Uptake Ratio: 31 % (ref 24–39)
T4, Total: 7.4 ug/dL (ref 4.5–12.0)
TSH: 3.55 u[IU]/mL (ref 0.450–4.500)

## 2020-12-17 ENCOUNTER — Other Ambulatory Visit: Payer: Self-pay | Admitting: Nurse Practitioner

## 2020-12-17 DIAGNOSIS — E785 Hyperlipidemia, unspecified: Secondary | ICD-10-CM

## 2020-12-17 DIAGNOSIS — I10 Essential (primary) hypertension: Secondary | ICD-10-CM

## 2021-03-14 IMAGING — DX DG ABDOMEN 1V
1 series · 1 of 1 positions shown · non-contrast
Comparison: CT 07/19/2019

CLINICAL DATA: 64-year-old female with a history of left-sided
nephrolithiasis

EXAM:
ABDOMEN - 1 VIEW

[abdomen kub]
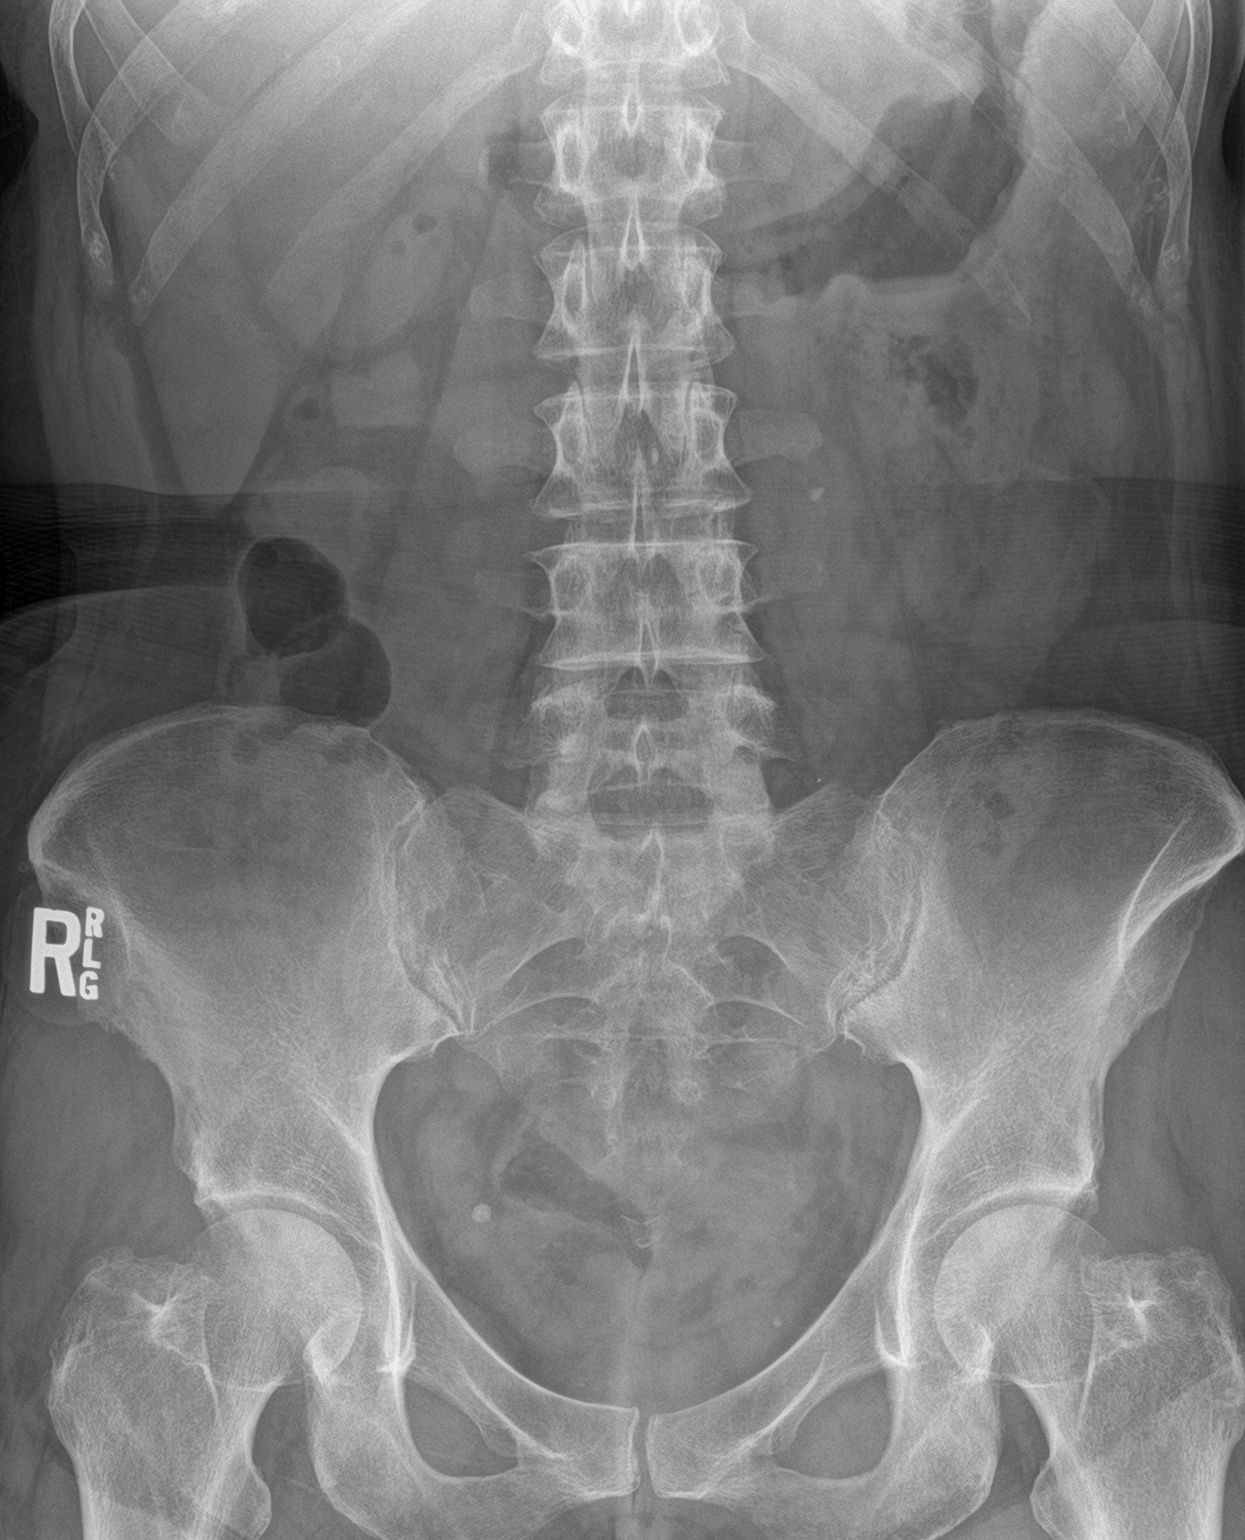

[1 of 1 positions shown; findings below may reference images not displayed]

FINDINGS: Gas within stomach small bowel and colon.  No abnormal distention.

No calcifications projecting in the region of the right renal
silhouette.

Geometric calcification in the left abdomen between the L3 and L4
spinous process, compatible with findings on prior CT.

Unremarkable skeletal structures.
IMPRESSION: Plain film demonstrates evidence of left-sided ureteral stone,
correlating with findings on prior CT.

## 2021-04-12 ENCOUNTER — Encounter: Payer: Self-pay | Admitting: Nurse Practitioner

## 2021-04-12 ENCOUNTER — Ambulatory Visit (INDEPENDENT_AMBULATORY_CARE_PROVIDER_SITE_OTHER): Payer: Managed Care, Other (non HMO) | Admitting: Nurse Practitioner

## 2021-04-12 VITALS — BP 136/72 | HR 61 | Temp 97.8°F | Resp 20 | Ht 64.0 in | Wt 136.0 lb

## 2021-04-12 DIAGNOSIS — Z6829 Body mass index (BMI) 29.0-29.9, adult: Secondary | ICD-10-CM | POA: Diagnosis not present

## 2021-04-12 DIAGNOSIS — E039 Hypothyroidism, unspecified: Secondary | ICD-10-CM

## 2021-04-12 DIAGNOSIS — I10 Essential (primary) hypertension: Secondary | ICD-10-CM

## 2021-04-12 DIAGNOSIS — E785 Hyperlipidemia, unspecified: Secondary | ICD-10-CM

## 2021-04-12 MED ORDER — ROSUVASTATIN CALCIUM 10 MG PO TABS: 10.0000 mg | ORAL_TABLET | Freq: Every day | ORAL | 1 refills | Status: DC

## 2021-04-12 MED ORDER — LISINOPRIL 10 MG PO TABS: 10.0000 mg | ORAL_TABLET | Freq: Every day | ORAL | 1 refills | Status: DC

## 2021-04-12 MED ORDER — LEVOTHYROXINE SODIUM 75 MCG PO TABS: 75.0000 ug | ORAL_TABLET | Freq: Every day | ORAL | 1 refills | Status: DC

## 2021-04-12 NOTE — Patient Instructions (Signed)
Stress, Adult °Stress is a normal reaction to life events. Stress is what you feel when life demands more than you are used to, or more than you think you can handle. °Some stress can be useful, such as studying for a test or meeting a deadline at work. Stress that occurs too often or for too long can cause problems. Long-lasting stress is called chronic stress. Chronic stress can affect your emotional health and interfere with relationships and normal daily activities. °Too much stress can weaken your body's defense system (immune system) and increase your risk for physical illness. If you already have a medical problem, stress can make it worse. °What are the causes? °All sorts of life events can cause stress. An event that causes stress for one person may not be stressful for someone else. Major life events, whether positive or negative, commonly cause stress. Examples include: °Losing a job or starting a new job. °Losing a loved one. °Moving to a new town or home. °Getting married or divorced. °Having a baby. °Getting injured or sick. °Less obvious life events can also cause stress, especially if they occur day after day or in combination with each other. Examples include: °Working long hours. °Driving in traffic. °Caring for children. °Being in debt. °Being in a difficult relationship. °What are the signs or symptoms? °Stress can cause emotional and physical symptoms and can lead to unhealthy behaviors. These include the following: °Emotional symptoms °Anxiety. This is feeling worried, afraid, on edge, overwhelmed, or out of control. °Anger, including irritation or impatience. °Depression. This is feeling sad, down, helpless, or guilty. °Trouble focusing, remembering, or making decisions. °Physical symptoms °Aches and pains. These may affect your head, neck, back, stomach, or other areas of your body. °Tight muscles or a clenched jaw. °Low energy. °Trouble sleeping. °Unhealthy behaviors °Eating to feel better  (overeating) or skipping meals. °Working too much or putting off tasks. °Smoking, drinking alcohol, or using drugs to feel better. °How is this diagnosed? °A stress disorder is diagnosed through an assessment by your health care provider. A stress disorder may be diagnosed based on: °Your symptoms and any stressful life events. °Your medical history. °Tests to rule out other causes of your symptoms. °Depending on your condition, your health care provider may refer you to a specialist for further evaluation. °How is this treated? °Stress management techniques are the recommended treatment for stress. Medicine is not typically recommended for treating stress. °Techniques to reduce your reaction to stressful life events include: °Identifying stress. Monitor yourself for symptoms of stress and notice what causes stress for you. These skills may help you to avoid or prepare for stressful events. °Managing time. Set your priorities, keep a calendar of events, and learn to say no. These actions can help you avoid taking on too much. °Techniques for dealing with stress include: °Rethinking the problem. Try to think realistically about stressful events rather than ignoring them or overreacting. Try to find the positives in a stressful situation rather than focusing on the negatives. °Exercise. Physical exercise can release both physical and emotional tension. The key is to find a form of exercise that you enjoy and do it regularly. °Relaxation techniques. These relax the body and mind. Find one or more that you enjoy and use the techniques regularly. Examples include: °Meditation, deep breathing, or progressive relaxation techniques. °Yoga or tai chi. °Biofeedback, mindfulness techniques, or journaling. °Listening to music, being in nature, or taking part in other hobbies. °Practicing a healthy lifestyle. Eat a balanced diet,   drink plenty of water, limit or avoid caffeine, and get plenty of sleep. °Having a strong support  network. Spend time with family, friends, or other people you enjoy being around. Express your feelings and talk things over with someone you trust. °Counseling or talk therapy with a mental health provider may help if you are having trouble managing stress by yourself. °Follow these instructions at home: °Lifestyle ° °Avoid drugs. °Do not use any products that contain nicotine or tobacco. These products include cigarettes, chewing tobacco, and vaping devices, such as e-cigarettes. If you need help quitting, ask your health care provider. °If you drink alcohol: °Limit how much you have to: °0-1 drink a day for women who are not pregnant. °0-2 drinks a day for men. °Know how much alcohol is in a drink. In the [place] one drink equals one 12 oz bottle of beer (355 mL), one 5 oz glass of wine (148 mL), or one 1½ oz glass of hard liquor (44 mL). °Do not use alcohol or drugs to relax. °Eat a balanced diet that includes fresh fruits and vegetables, whole grains, lean meats, fish, eggs, beans, and low-fat dairy. Avoid processed foods and foods high in added fat, sugar, and salt. °Exercise at least 30 minutes on 5 or more days each week. °Get 7-8 hours of sleep each night. °General instructions ° °Practice stress management techniques as told by your health care provider. °Drink enough fluid to keep your urine pale yellow. °Take over-the-counter and prescription medicines only as told by your health care provider. °Keep all follow-up visits. This is important. °Contact a health care provider if: °Your symptoms get worse. °You have new symptoms. °You feel overwhelmed by your problems and can no longer manage them by yourself. °Get help right away if: °You have thoughts of hurting yourself or others. °Get help right awayif you feel like you may hurt yourself or others, or have thoughts about taking your own life. Go to your nearest emergency room or: °Call 911. °Call the National Suicide Prevention Lifeline at 1-800-273-8255 or  988. This is open 24 hours a day. °Text the Crisis Text Line at 741741. °Summary °Stress is a normal reaction to life events. It can cause problems if it happens too often or for too long. °Practicing stress management techniques is the best way to treat stress. °Counseling or talk therapy with a mental health provider may help if you are having trouble managing stress by yourself. °This information is not intended to replace advice given to you by your health care provider. Make sure you discuss any questions you have with your health care provider. °Document Revised: 08/18/2020 Document Reviewed: 08/18/2020 °Elsevier Patient Education © 2022 [employer]. ° °

## 2021-04-12 NOTE — Progress Notes (Signed)
? ?Subjective:  ? ? Patient ID: Teresa Ayala, female    DOB: 16-Jun-1955, 66 y.o.   MRN: [id number] ? ? ?Chief Complaint: No chief complaint on file. ?  ? ?HPI: ? ?Teresa Ayala is a 66 y.o. who identifies as a female who was assigned female at birth.  ? 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 ? ?Comes in today for follow up of the following chronic medical issues: ? ?1. Primary hypertension ?No c/o chest pain, sob or headache. She does not check her blood pressure at home. ?BP Readings from Last 3 Encounters:  ?10/13/20 114/65  ?04/11/20 132/73  ?10/12/19 124/64  ? ? ? ?2. Hyperlipidemia with target LDL less than 100 ?Does watch diet and stays very active. ?Lab Results  ?Component Value Date  ? CHOL 126 10/13/2020  ? HDL 52 10/13/2020  ? [place] 58 10/13/2020  ? TRIG 78 10/13/2020  ? CHOLHDL 2.4 10/13/2020  ? ? ? ?3. Acquired hypothyroidism ?No problems that she is aware of ?Lab Results  ?Component Value Date  ? TSH 3.550 10/13/2020  ? ? ? ?4. BMI 29.0-29.9,adult ?Weight is up 4 lbs ?Wt Readings from Last 3 Encounters:  ?04/12/21 136 lb (61.7 kg)  ?10/13/20 132 lb (59.9 kg)  ?04/11/20 131 lb (59.4 kg)  ? ?BMI Readings from Last 3 Encounters:  ?04/12/21 23.34 kg/m?  ?10/13/20 22.66 kg/m?  ?04/11/20 22.49 kg/m?  ? ? ? ?New complaints: ?None today ? ?No Known Allergies ?Outpatient Encounter Medications as of 04/12/2021  ?Medication Sig  ? levothyroxine (SYNTHROID) 75 MCG tablet Take 1 tablet (75 mcg total) by mouth daily.  ? lisinopril (ZESTRIL) 10 MG tablet TAKE 1 TABLET BY MOUTH EVERY DAY  ? rosuvastatin (CRESTOR) 10 MG tablet TAKE 1 TABLET BY MOUTH EVERY DAY  ? ?No facility-administered encounter medications on file as of 04/12/2021.  ? ? ?Past Surgical History:  ?Procedure Laterality Date  ? CHOLECYSTECTOMY  2000  ? EXTRACORPOREAL SHOCK WAVE LITHOTRIPSY Left 07/30/2019  ? Procedure: LEFT EXTRACORPOREAL SHOCK WAVE LITHOTRIPSY (ESWL);  Surgeon: Alexis Frock, MD;   Location: [facility];  Service: Urology;  Laterality: Left;  ? ? ?Family History  ?Problem Relation Age of Onset  ? Cancer Father   ?     LUNG  ? Colon cancer Neg Hx   ? ? ? ? ?Controlled substance contract: n/a ? ? ? ? ?Review of Systems  ?Constitutional:  Negative for diaphoresis.  ?Eyes:  Negative for pain.  ?Respiratory:  Negative for shortness of breath.   ?Cardiovascular:  Negative for chest pain, palpitations and leg swelling.  ?Gastrointestinal:  Negative for abdominal pain.  ?Endocrine: Negative for polydipsia.  ?Skin:  Negative for rash.  ?Neurological:  Negative for dizziness, weakness and headaches.  ?Hematological:  Does not bruise/bleed easily.  ?All other systems reviewed and are negative. ? ?   ?Objective:  ? Physical Exam ?Vitals and nursing note reviewed.  ?Constitutional:   ?   General: She is not in acute distress. ?   Appearance: Normal appearance. She is well-developed.  ?HENT:  ?   Head: Normocephalic.  ?   Right Ear: Tympanic membrane normal.  ?   Left Ear: Tympanic membrane normal.  ?   Nose: Nose normal.  ?   Mouth/Throat:  ?   Mouth: Mucous membranes are moist.  ?Eyes:  ?   Pupils: Pupils are equal, round, and reactive to light.  ?Neck:  ?  Vascular: No carotid bruit or JVD.  ?Cardiovascular:  ?   Rate and Rhythm: Normal rate and regular rhythm.  ?   Heart sounds: Normal heart sounds.  ?Pulmonary:  ?   Effort: Pulmonary effort is normal. No respiratory distress.  ?   Breath sounds: Normal breath sounds. No wheezing or rales.  ?Chest:  ?   Chest wall: No tenderness.  ?Abdominal:  ?   General: Bowel sounds are normal. There is no distension or abdominal bruit.  ?   Palpations: Abdomen is soft. There is no hepatomegaly, splenomegaly, mass or pulsatile mass.  ?   Tenderness: There is no abdominal tenderness.  ?Musculoskeletal:     ?   General: Normal range of motion.  ?   Cervical back: Normal range of motion and neck supple.  ?Lymphadenopathy:  ?   Cervical: No cervical  adenopathy.  ?Skin: ?   General: Skin is warm and dry.  ?Neurological:  ?   Mental Status: She is alert and oriented to person, place, and time.  ?   Deep Tendon Reflexes: Reflexes are normal and symmetric.  ?Psychiatric:     ?   Behavior: Behavior normal.     ?   Thought Content: Thought content normal.     ?   Judgment: Judgment normal.  ? ? ?BP 136/72   Pulse 61   Temp 97.8 ?F (36.6 ?C) (Temporal)   Resp 20   Ht _0  (1.626 m)   Wt 136 lb (61.7 kg)   SpO2 100%   BMI 23.34 kg/m?  ? ? ? ?   ?Assessment & Plan:  ? ?Lacoya Wilbanks comes in today with chief complaint of Medical Management of Chronic Issues ? ? ?Diagnosis and orders addressed: ? ?1. Primary hypertension ?Low sodium diet ?- lisinopril (ZESTRIL) 10 MG tablet; Take 1 tablet (10 mg total) by mouth daily.  Dispense: 90 tablet; Refill: 1 ?- CBC with Differential/Platelet ?- CMP14+EGFR ? ?2. Hyperlipidemia with target LDL less than 100 ?Low fat diet ?- rosuvastatin (CRESTOR) 10 MG tablet; Take 1 tablet (10 mg total) by mouth daily.  Dispense: 90 tablet; Refill: 1 ?- Lipid panel ? ?3. Acquired hypothyroidism ?Labs pending ?- levothyroxine (SYNTHROID) 75 MCG tablet; Take 1 tablet (75 mcg total) by mouth daily.  Dispense: 90 tablet; Refill: 1 ?- Thyroid Panel With TSH ? ?4. BMI 29.0-29.9,adult ?Discussed diet and exercise for person with BMI >25 ?Ayala recheck weight in 3-6 months ? ? ? ?Labs pending ?Health Maintenance reviewed ?Diet and exercise encouraged ? ?Follow up plan: ?6 month ? ? ?Mary-Margaret Hassell Done, FNP ? ?

## 2021-04-13 LAB — CBC WITH DIFFERENTIAL/PLATELET
Basophils Absolute: 0 10*3/uL (ref 0.0–0.2)
Basos: 1 %
EOS (ABSOLUTE): 0.1 10*3/uL (ref 0.0–0.4)
Eos: 2 %
Hematocrit: 37.7 % (ref 34.0–46.6)
Hemoglobin: 12.8 g/dL (ref 11.1–15.9)
Immature Grans (Abs): 0 10*3/uL (ref 0.0–0.1)
Immature Granulocytes: 0 %
Lymphocytes Absolute: 2.2 10*3/uL (ref 0.7–3.1)
Lymphs: 31 %
MCH: 32.2 pg (ref 26.6–33.0)
MCHC: 34 g/dL (ref 31.5–35.7)
MCV: 95 fL (ref 79–97)
Monocytes Absolute: 0.5 10*3/uL (ref 0.1–0.9)
Monocytes: 7 %
Neutrophils Absolute: 4.3 10*3/uL (ref 1.4–7.0)
Neutrophils: 59 %
Platelets: 201 10*3/uL (ref 150–450)
RBC: 3.97 x10E6/uL (ref 3.77–5.28)
RDW: 12.9 % (ref 11.7–15.4)
WBC: 7.1 10*3/uL (ref 3.4–10.8)

## 2021-04-13 LAB — CMP14+EGFR
ALT: 15 IU/L (ref 0–32)
AST: 24 IU/L (ref 0–40)
Albumin/Globulin Ratio: 1.6 (ref 1.2–2.2)
Albumin: 4.1 g/dL (ref 3.8–4.8)
Alkaline Phosphatase: 60 IU/L (ref 44–121)
BUN/Creatinine Ratio: 17 (ref 12–28)
BUN: 16 mg/dL (ref 8–27)
Bilirubin Total: 0.4 mg/dL (ref 0.0–1.2)
CO2: 23 mmol/L (ref 20–29)
Calcium: 9.3 mg/dL (ref 8.7–10.3)
Chloride: 106 mmol/L (ref 96–106)
Creatinine, Ser: 0.94 mg/dL (ref 0.57–1.00)
Globulin, Total: 2.6 g/dL (ref 1.5–4.5)
Glucose: 92 mg/dL (ref 70–99)
Potassium: 4.8 mmol/L (ref 3.5–5.2)
Sodium: 142 mmol/L (ref 134–144)
Total Protein: 6.7 g/dL (ref 6.0–8.5)
eGFR: 68 mL/min/{1.73_m2} (ref >59)

## 2021-04-13 LAB — LIPID PANEL
Chol/HDL Ratio: 2.2 ratio (ref 0.0–4.4)
Cholesterol, Total: 130 mg/dL (ref 100–199)
HDL: 58 mg/dL (ref >39)
LDL Chol Calc (NIH): 60 mg/dL (ref 0–99)
Triglycerides: 56 mg/dL (ref 0–149)
VLDL Cholesterol Cal: 12 mg/dL (ref 5–40)

## 2021-04-13 LAB — THYROID PANEL WITH TSH
Free Thyroxine Index: 2.2 (ref 1.2–4.9)
T3 Uptake Ratio: 30 % (ref 24–39)
T4, Total: 7.4 ug/dL (ref 4.5–12.0)
TSH: 1.76 u[IU]/mL (ref 0.450–4.500)

## 2021-04-18 ENCOUNTER — Ambulatory Visit (INDEPENDENT_AMBULATORY_CARE_PROVIDER_SITE_OTHER): Payer: Managed Care, Other (non HMO) | Admitting: *Deleted

## 2021-04-18 DIAGNOSIS — Z23 Encounter for immunization: Secondary | ICD-10-CM

## 2021-04-20 ENCOUNTER — Ambulatory Visit: Payer: Managed Care, Other (non HMO) | Admitting: Nurse Practitioner

## 2021-05-09 ENCOUNTER — Encounter: Payer: Self-pay | Admitting: Nurse Practitioner

## 2021-05-09 ENCOUNTER — Ambulatory Visit (INDEPENDENT_AMBULATORY_CARE_PROVIDER_SITE_OTHER): Payer: Managed Care, Other (non HMO)

## 2021-05-09 ENCOUNTER — Ambulatory Visit (INDEPENDENT_AMBULATORY_CARE_PROVIDER_SITE_OTHER): Payer: Managed Care, Other (non HMO) | Admitting: Nurse Practitioner

## 2021-05-09 VITALS — BP 126/72 | HR 70 | Temp 98.2°F | Resp 20 | Ht 64.0 in | Wt 131.0 lb

## 2021-05-09 DIAGNOSIS — K5901 Slow transit constipation: Secondary | ICD-10-CM | POA: Diagnosis not present

## 2021-05-09 DIAGNOSIS — R1084 Generalized abdominal pain: Secondary | ICD-10-CM

## 2021-05-09 NOTE — Progress Notes (Signed)
? ?Subjective:  ? ? Patient ID: Teresa Ayala, female    DOB: 04-07-1955, 66 y.o.   MRN: [id number] ? ? ?Chief Complaint: Abdominal Pain ? ? ?Abdominal Pain ?Pertinent negatives include no headaches.  ?Patient in c/o abdominal pain. It is mainly on right side. She has seen GYN andhad vaginal U/s and did not show anything. The pain has been intermittent for over a year now. Pain lasts 2-3 hours. Having a bowel movements relieves the pain. Rates pain 5/10.  ? ?Patient had CT scan at [facility] urology 09/2020 and was normal. Last colonoscopy was done in 2017. ? ?Review of Systems  ?Constitutional:  Negative for diaphoresis.  ?Eyes:  Negative for pain.  ?Respiratory:  Negative for shortness of breath.   ?Cardiovascular:  Negative for chest pain, palpitations and leg swelling.  ?Gastrointestinal:  Positive for abdominal pain.  ?Endocrine: Negative for polydipsia.  ?Skin:  Negative for rash.  ?Neurological:  Negative for dizziness, weakness and headaches.  ?Hematological:  Does not bruise/bleed easily.  ?All other systems reviewed and are negative. ? ?   ?Objective:  ? Physical Exam ?Vitals and nursing note reviewed.  ?Constitutional:   ?   General: She is not in acute distress. ?   Appearance: Normal appearance. She is well-developed.  ?HENT:  ?   Head: Normocephalic.  ?   Right Ear: Tympanic membrane normal.  ?   Left Ear: Tympanic membrane normal.  ?   Nose: Nose normal.  ?   Mouth/Throat:  ?   Mouth: Mucous membranes are moist.  ?Eyes:  ?   Pupils: Pupils are equal, round, and reactive to light.  ?Neck:  ?   Vascular: No carotid bruit or JVD.  ?Cardiovascular:  ?   Rate and Rhythm: Normal rate and regular rhythm.  ?   Heart sounds: Normal heart sounds.  ?Pulmonary:  ?   Effort: Pulmonary effort is normal. No respiratory distress.  ?   Breath sounds: Normal breath sounds. No wheezing or rales.  ?Chest:  ?   Chest wall: No tenderness.  ?Abdominal:  ?   General: Bowel sounds are normal. There is no distension or abdominal bruit.   ?   Palpations: Abdomen is soft. There is no hepatomegaly, splenomegaly, mass or pulsatile mass.  ?   Tenderness: There is no abdominal tenderness.  ?Musculoskeletal:     ?   General: Normal range of motion.  ?   Cervical back: Normal range of motion and neck supple.  ?Lymphadenopathy:  ?   Cervical: No cervical adenopathy.  ?Skin: ?   General: Skin is warm and dry.  ?Neurological:  ?   Mental Status: She is alert and oriented to person, place, and time.  ?   Deep Tendon Reflexes: Reflexes are normal and symmetric.  ?Psychiatric:     ?   Behavior: Behavior normal.     ?   Thought Content: Thought content normal.     ?   Judgment: Judgment normal.  ? ?BP 126/72   Pulse 70   Temp 98.2 ?F (36.8 ?C) (Temporal)   Resp 20   Ht 5\' 4"  (1.626 m)   Wt 131 lb (59.4 kg)   SpO2 100%   BMI 22.49 kg/m?  ? ?KUB- moderate stool burden throughout colon-Preliminary reading by , FNP  [facility] ? ? ? ?   ?Assessment & Plan:  ?Paulene Floor in today with chief complaint of Abdominal Pain ? ? ?1. Generalized abdominal pain ?- DG Abd 1 View ? ?  2. Slow transit constipation ?Miralax daily in apple juice ?Force fluids ?Increase fiber in diet ? ? ? ?The above assessment and management plan was discussed with the patient. The patient verbalized understanding of and has agreed to the management plan. Patient is aware to call the clinic if symptoms persist or worsen. Patient is aware when to return to the clinic for a follow-up visit. Patient educated on when it is appropriate to go to the emergency department.  ? ?Mary-Margaret Daphine Deutscher, FNP ? ? ? ?

## 2021-05-09 NOTE — Patient Instructions (Signed)

## 2021-07-13 ENCOUNTER — Telehealth: Payer: Managed Care, Other (non HMO) | Admitting: Physician Assistant

## 2021-07-13 DIAGNOSIS — U071 COVID-19: Secondary | ICD-10-CM | POA: Diagnosis not present

## 2021-07-13 MED ORDER — LIDOCAINE VISCOUS HCL 2 % MT SOLN: OROMUCOSAL | 0 refills | Status: DC

## 2021-07-13 NOTE — Patient Instructions (Signed)
Loraine Maple, thank you for joining Margaretann Loveless, PA-C for today's virtual visit.  While this provider is not your primary care provider (PCP), if your PCP is located in our provider database this encounter information will be shared with them immediately following your visit.  Consent: (Patient) Teresa Ayala provided verbal consent for this virtual visit at the beginning of the encounter.  Current Medications:  Current Outpatient Medications:    lidocaine (XYLOCAINE) 2 % solution, Swallow 10mL every 4 hours as needed for sore throat, Disp: 100 mL, Rfl: 0   levothyroxine (SYNTHROID) 75 MCG tablet, Take 1 tablet (75 mcg total) by mouth daily., Disp: 90 tablet, Rfl: 1   lisinopril (ZESTRIL) 10 MG tablet, Take 1 tablet (10 mg total) by mouth daily., Disp: 90 tablet, Rfl: 1   rosuvastatin (CRESTOR) 10 MG tablet, Take 1 tablet (10 mg total) by mouth daily., Disp: 90 tablet, Rfl: 1   Medications ordered in this encounter:  Meds ordered this encounter  Medications   lidocaine (XYLOCAINE) 2 % solution    Sig: Swallow 67mL every 4 hours as needed for sore throat    Dispense:  100 mL    Refill:  0    Order Specific Question:   Supervising Provider    Answer:   Eber Hong [[id number]]     *If you need refills on other medications prior to your next appointment, please contact your pharmacy*  Follow-Up: Call back or seek an in-person evaluation if the symptoms worsen or if the condition fails to improve as anticipated.  Other Instructions Can take to lessen severity: Vit C 500mg  twice daily Quercertin 250-500mg  twice daily Zinc 75-100mg  daily Melatonin 3-6 mg at bedtime Vit D3 1000-2000 IU daily Aspirin 81 mg daily with food Optional: Famotidine 20mg  daily Also can add tylenol/ibuprofen as needed for fevers and body aches May add Mucinex or Mucinex DM as needed for cough/congestion   10 Things You Can Do to Manage Your COVID-19 Symptoms at Home If you have possible or confirmed  COVID-19 Stay home except to get medical care. Monitor your symptoms carefully. If your symptoms get worse, call your healthcare provider immediately. Get rest and stay hydrated. If you have a medical appointment, call the healthcare provider ahead of time and tell them that you have or may have COVID-19. For medical emergencies, call 911 and notify the dispatch personnel that you have or may have COVID-19. Cover your cough and sneezes with a tissue or use the inside of your elbow. Wash your hands often with soap and water for at least 20 seconds or clean your hands with an alcohol-based hand sanitizer that contains at least 60% alcohol. As much as possible, stay in a specific room and away from other people in your home. Also, you should use a separate bathroom, if available. If you need to be around other people in or outside of the home, wear a mask. Avoid sharing personal items with other people in your household, like dishes, towels, and bedding. Clean all surfaces that are touched often, like counters, tabletops, and doorknobs. Use household cleaning sprays or wipes according to the label instructions. 08/07/2019 This information is not intended to replace advice given to you by your health care provider. Make sure you discuss any questions you have with your health care provider. Document Revised: 09/30/2020 Document Reviewed: 09/30/2020 Elsevier Patient Education  2022 11/30/2020.    If you have been instructed to have an in-person evaluation today at a local  Urgent Care facility, please use the link below. It will take you to a list of all of our available [facility], including address, phone number and hours of operation. Please do not delay care.  [facility]  If you or a family member do not have a primary care provider, use the link below to schedule a visit and establish care. When you choose a [facility] primary care physician or  advanced practice provider, you gain a long-term partner in health. Find a Primary Care Provider  Learn more about [facility]'s in-office and virtual care options: [facility] - Get Care Now

## 2021-07-13 NOTE — Progress Notes (Signed)
Virtual Visit Consent   Teresa Ayala, you are scheduled for a virtual visit with a [facility] provider today. Just as with appointments in the office, your consent must be obtained to participate. Your consent will be active for this visit and any virtual visit you may have with one of our providers in the next 365 days. If you have a MyChart account, a copy of this consent can be sent to you electronically.  As this is a virtual visit, video technology does not allow for your provider to perform a traditional examination. This may limit your provider's ability to fully assess your condition. If your provider identifies any concerns that need to be evaluated in person or the need to arrange testing (such as labs, EKG, etc.), we will make arrangements to do so. Although advances in technology are sophisticated, we cannot ensure that it will always work on either your end or our end. If the connection with a video visit is poor, the visit may have to be switched to a telephone visit. With either a video or telephone visit, we are not always able to ensure that we have a secure connection.  By engaging in this virtual visit, you consent to the provision of healthcare and authorize for your insurance to be billed (if applicable) for the services provided during this visit. Depending on your insurance coverage, you may receive a charge related to this service.  I need to obtain your verbal consent now. Are you willing to proceed with your visit today? Ethie Curless has provided verbal consent on 07/13/2021 for a virtual visit (video or telephone). Margaretann Loveless, PA-C  Date: 07/13/2021 10:02 AM  Virtual Visit via Video Note   I, Margaretann Loveless, connected with  Teresa Ayala  ([id number], 04/02/1955) on 07/13/21 at  9:45 AM EDT by a video-enabled telemedicine application and verified that I am speaking with the correct person using two identifiers.  Location: Patient: Virtual Visit Location Patient:  Home Provider: Virtual Visit Location Provider: Home Office   I discussed the limitations of evaluation and management by telemedicine and the availability of in person appointments. The patient expressed understanding and agreed to proceed.    History of Present Illness: Teresa Ayala is a 66 y.o. who identifies as a female who was assigned female at birth, and is being seen today for covid 16.  HPI: URI  This is a new problem. Episode onset: Tested positive for Covid 19 this morning, symptoms started Monday mild like allergies, progressed to worst yesterday, now improving. The problem has been gradually improving. The maximum temperature recorded prior to her arrival was 101 - 101.9 F (101.9 last night, 99.8 this morning). The fever has been present for Less than 1 day. Associated symptoms include congestion, coughing, ear pain, headaches, rhinorrhea, sinus pain and a sore throat. Pertinent negatives include no diarrhea, nausea or vomiting. Treatments tried: sudafed, tylenol. The treatment provided mild relief.      Problems:  Patient Active Problem List   Diagnosis Date Noted   BMI 29.0-29.9,adult 09/30/2014   Hypertension 11/24/2012   Hyperlipidemia with target LDL less than 100 11/24/2012   Hypothyroidism 11/24/2012    Allergies: No Known Allergies Medications:  Current Outpatient Medications:    lidocaine (XYLOCAINE) 2 % solution, Swallow 62mL every 4 hours as needed for sore throat, Disp: 100 mL, Rfl: 0   levothyroxine (SYNTHROID) 75 MCG tablet, Take 1 tablet (75 mcg total) by mouth daily., Disp: 90 tablet, Rfl: 1  lisinopril (ZESTRIL) 10 MG tablet, Take 1 tablet (10 mg total) by mouth daily., Disp: 90 tablet, Rfl: 1   rosuvastatin (CRESTOR) 10 MG tablet, Take 1 tablet (10 mg total) by mouth daily., Disp: 90 tablet, Rfl: 1  Observations/Objective: Patient is well-developed, well-nourished in no acute distress.  Resting comfortably at home.  Head is normocephalic, atraumatic.  No  labored breathing.  Speech is clear and coherent with logical content.  Patient is alert and oriented at baseline.    Assessment and Plan: 1. COVID-19 - lidocaine (XYLOCAINE) 2 % solution; Swallow 7mL every 4 hours as needed for sore throat  Dispense: 100 mL; Refill: 0 - MyChart COVID-19 home monitoring program; Future  - Continue OTC symptomatic management of choice - Will send OTC vitamins and supplement information through AVS - Viscous lidocaine prescribed - Patient enrolled in MyChart symptom monitoring - Push fluids - Rest as needed - Discussed return precautions and when to seek in-person evaluation, sent via AVS as well   Follow Up Instructions: I discussed the assessment and treatment plan with the patient. The patient was provided an opportunity to ask questions and all were answered. The patient agreed with the plan and demonstrated an understanding of the instructions.  A copy of instructions were sent to the patient via MyChart unless otherwise noted below.    The patient was advised to call back or seek an in-person evaluation if the symptoms worsen or if the condition fails to improve as anticipated.  Time:  I spent 12 minutes with the patient via telehealth technology discussing the above problems/concerns.    Margaretann Loveless, PA-C

## 2021-08-22 ENCOUNTER — Ambulatory Visit (INDEPENDENT_AMBULATORY_CARE_PROVIDER_SITE_OTHER): Payer: Managed Care, Other (non HMO) | Admitting: *Deleted

## 2021-08-22 DIAGNOSIS — Z23 Encounter for immunization: Secondary | ICD-10-CM | POA: Diagnosis not present

## 2021-10-16 ENCOUNTER — Encounter: Payer: Self-pay | Admitting: Nurse Practitioner

## 2021-10-16 ENCOUNTER — Other Ambulatory Visit: Payer: Self-pay | Admitting: Nurse Practitioner

## 2021-10-16 ENCOUNTER — Ambulatory Visit (INDEPENDENT_AMBULATORY_CARE_PROVIDER_SITE_OTHER): Payer: BC Managed Care – PPO | Admitting: Nurse Practitioner

## 2021-10-16 VITALS — BP 127/70 | HR 74 | Temp 97.7°F | Ht 64.0 in | Wt 131.8 lb

## 2021-10-16 DIAGNOSIS — E039 Hypothyroidism, unspecified: Secondary | ICD-10-CM | POA: Diagnosis not present

## 2021-10-16 DIAGNOSIS — Z6829 Body mass index (BMI) 29.0-29.9, adult: Secondary | ICD-10-CM

## 2021-10-16 DIAGNOSIS — E785 Hyperlipidemia, unspecified: Secondary | ICD-10-CM | POA: Diagnosis not present

## 2021-10-16 DIAGNOSIS — I10 Essential (primary) hypertension: Secondary | ICD-10-CM | POA: Diagnosis not present

## 2021-10-16 DIAGNOSIS — Z23 Encounter for immunization: Secondary | ICD-10-CM

## 2021-10-16 MED ORDER — LEVOTHYROXINE SODIUM 75 MCG PO TABS: 75.0000 ug | ORAL_TABLET | Freq: Every day | ORAL | 1 refills | Status: DC

## 2021-10-16 MED ORDER — ROSUVASTATIN CALCIUM 10 MG PO TABS: 10.0000 mg | ORAL_TABLET | Freq: Every day | ORAL | 1 refills | Status: DC

## 2021-10-16 MED ORDER — LISINOPRIL 10 MG PO TABS: 10.0000 mg | ORAL_TABLET | Freq: Every day | ORAL | 1 refills | Status: DC

## 2021-10-16 NOTE — Addendum Note (Signed)
Addended by: Chevis Pretty on: 10/16/2021 08:41 AM   Modules accepted: Orders

## 2021-10-16 NOTE — Patient Instructions (Signed)

## 2021-10-16 NOTE — Progress Notes (Signed)
Subjective:    Patient ID: Teresa Ayala, female    DOB: Apr 24, 1955, 66 y.o.   MRN: [id number]   Chief Complaint: Medical Management of Chronic Issues    HPI:  Teresa Ayala is a 66 y.o. who identifies as a female who was assigned female at birth.   Social history: Lives with: husband Work history: in [place]   Comes in today for follow up of the following chronic medical issues:  1. Primary hypertension No c/o chest pain, sob or headache. Does not check blood pressure at home. BP Readings from Last 3 Encounters:  05/09/21 126/72  04/12/21 136/72  10/13/20 114/65     2. Hyperlipidemia with target LDL less than 100 Does try to watch diet and does walk several times a week. Lab Results  Component Value Date   CHOL 130 04/12/2021   HDL 58 04/12/2021   LDLCALC 60 04/12/2021   TRIG 56 04/12/2021   CHOLHDL 2.2 04/12/2021   The 10-year ASCVD risk score (Arnett DK, et al., 2019) is: 5.7%   3. Acquired hypothyroidism No problems that she is aware of. Lab Results  Component Value Date   TSH 1.760 04/12/2021     4. BMI 29.0-29.9,adult No recent weight changes Wt Readings from Last 3 Encounters:  10/16/21 131 lb 12.8 oz (59.8 kg)  05/09/21 131 lb (59.4 kg)  04/12/21 136 lb (61.7 kg)  ' BMI Readings from Last 3 Encounters:  10/16/21 22.62 kg/m  05/09/21 22.49 kg/m  04/12/21 23.34 kg/m       New complaints: None today  No Known Allergies Outpatient Encounter Medications as of 10/16/2021  Medication Sig   levothyroxine (SYNTHROID) 75 MCG tablet Take 1 tablet (75 mcg total) by mouth daily.   lisinopril (ZESTRIL) 10 MG tablet Take 1 tablet (10 mg total) by mouth daily.   rosuvastatin (CRESTOR) 10 MG tablet Take 1 tablet (10 mg total) by mouth daily.   lidocaine (XYLOCAINE) 2 % solution Swallow 49mL every 4 hours as needed for sore throat (Patient not taking: Reported on 10/16/2021)   No facility-administered encounter medications on file as of 10/16/2021.     Past Surgical History:  Procedure Laterality Date   CHOLECYSTECTOMY  2000   EXTRACORPOREAL SHOCK WAVE LITHOTRIPSY Left 07/30/2019   Procedure: LEFT EXTRACORPOREAL SHOCK WAVE LITHOTRIPSY (ESWL);  Surgeon: Alexis Frock, MD;  Location: [facility];  Service: Urology;  Laterality: Left;    Family History  Problem Relation Age of Onset   Cancer Father        LUNG   Colon cancer Neg Hx       Controlled substance contract: n/a     Review of Systems  Constitutional:  Negative for diaphoresis.  Eyes:  Negative for pain.  Respiratory:  Negative for shortness of breath.   Cardiovascular:  Negative for chest pain, palpitations and leg swelling.  Gastrointestinal:  Negative for abdominal pain.  Endocrine: Negative for polydipsia.  Skin:  Negative for rash.  Neurological:  Negative for dizziness, weakness and headaches.  Hematological:  Does not bruise/bleed easily.  All other systems reviewed and are negative.      Objective:   Physical Exam Vitals and nursing note reviewed.  Constitutional:      General: She is not in acute distress.    Appearance: Normal appearance. She is well-developed.  HENT:     Head: Normocephalic.     Right Ear: Tympanic membrane normal.     Left Ear: Tympanic membrane normal.  Nose: Nose normal.     Mouth/Throat:     Mouth: Mucous membranes are moist.  Eyes:     Pupils: Pupils are equal, round, and reactive to light.  Neck:     Vascular: No carotid bruit or JVD.  Cardiovascular:     Rate and Rhythm: Normal rate and regular rhythm.     Heart sounds: Normal heart sounds.  Pulmonary:     Effort: Pulmonary effort is normal. No respiratory distress.     Breath sounds: Normal breath sounds. No wheezing or rales.  Chest:     Chest wall: No tenderness.  Abdominal:     General: Bowel sounds are normal. There is no distension or abdominal bruit.     Palpations: Abdomen is soft. There is no hepatomegaly, splenomegaly, mass or  pulsatile mass.     Tenderness: There is no abdominal tenderness.  Musculoskeletal:        General: Normal range of motion.     Cervical back: Normal range of motion and neck supple.  Lymphadenopathy:     Cervical: No cervical adenopathy.  Skin:    General: Skin is warm and dry.  Neurological:     Mental Status: She is alert and oriented to person, place, and time.     Deep Tendon Reflexes: Reflexes are normal and symmetric.  Psychiatric:        Behavior: Behavior normal.        Thought Content: Thought content normal.        Judgment: Judgment normal.    BP 127/70   Pulse 74   Temp 97.7 F (36.5 C) (Temporal)   Ht 5\' 4"  (1.626 m)   Wt 131 lb 12.8 oz (59.8 kg)   SpO2 100%   BMI 22.62 kg/m         Assessment & Plan:   Teresa Ayala comes in today with chief complaint of Medical Management of Chronic Issues   Diagnosis and orders addressed:  1. Primary hypertension Low sodium diet - Pneumococcal conjugate vaccine 20-valent (Prevnar 20) - lisinopril (ZESTRIL) 10 MG tablet; Take 1 tablet (10 mg total) by mouth daily.  Dispense: 90 tablet; Refill: 1  2. Hyperlipidemia with target LDL less than 100 Low fat diet - rosuvastatin (CRESTOR) 10 MG tablet; Take 1 tablet (10 mg total) by mouth daily.  Dispense: 90 tablet; Refill: 1  3. Acquired hypothyroidism Labs pending - levothyroxine (SYNTHROID) 75 MCG tablet; Take 1 tablet (75 mcg total) by mouth daily.  Dispense: 90 tablet; Refill: 1  4. BMI 29.0-29.9,adult Discussed diet and exercise for person with BMI >25 Will recheck weight in 3-6 months    Labs pending Health Maintenance reviewed Diet and exercise encouraged  Follow up plan: 6 months   Mary-Margaret 10-20-1984, FNP

## 2021-10-17 LAB — LIPID PANEL
Chol/HDL Ratio: 2.2 ratio (ref 0.0–4.4)
Cholesterol, Total: 153 mg/dL (ref 100–199)
HDL: 70 mg/dL (ref >39)
LDL Chol Calc (NIH): 71 mg/dL (ref 0–99)
Triglycerides: 61 mg/dL (ref 0–149)
VLDL Cholesterol Cal: 12 mg/dL (ref 5–40)

## 2021-10-17 LAB — CBC WITH DIFFERENTIAL/PLATELET
Basophils Absolute: 0 10*3/uL (ref 0.0–0.2)
Basos: 0 %
EOS (ABSOLUTE): 0.1 10*3/uL (ref 0.0–0.4)
Eos: 1 %
Hematocrit: 37.2 % (ref 34.0–46.6)
Hemoglobin: 12.5 g/dL (ref 11.1–15.9)
Immature Grans (Abs): 0 10*3/uL (ref 0.0–0.1)
Immature Granulocytes: 0 %
Lymphocytes Absolute: 2.2 10*3/uL (ref 0.7–3.1)
Lymphs: 30 %
MCH: 31.1 pg (ref 26.6–33.0)
MCHC: 33.6 g/dL (ref 31.5–35.7)
MCV: 93 fL (ref 79–97)
Monocytes Absolute: 0.5 10*3/uL (ref 0.1–0.9)
Monocytes: 7 %
Neutrophils Absolute: 4.6 10*3/uL (ref 1.4–7.0)
Neutrophils: 62 %
Platelets: 205 10*3/uL (ref 150–450)
RBC: 4.02 x10E6/uL (ref 3.77–5.28)
RDW: 12.6 % (ref 11.7–15.4)
WBC: 7.4 10*3/uL (ref 3.4–10.8)

## 2021-10-17 LAB — CMP14+EGFR
ALT: 15 IU/L (ref 0–32)
AST: 24 IU/L (ref 0–40)
Albumin/Globulin Ratio: 1.6 (ref 1.2–2.2)
Albumin: 4.2 g/dL (ref 3.9–4.9)
Alkaline Phosphatase: 59 IU/L (ref 44–121)
BUN/Creatinine Ratio: 20 (ref 12–28)
BUN: 19 mg/dL (ref 8–27)
Bilirubin Total: 0.4 mg/dL (ref 0.0–1.2)
CO2: 22 mmol/L (ref 20–29)
Calcium: 9.4 mg/dL (ref 8.7–10.3)
Chloride: 104 mmol/L (ref 96–106)
Creatinine, Ser: 0.97 mg/dL (ref 0.57–1.00)
Globulin, Total: 2.6 g/dL (ref 1.5–4.5)
Glucose: 98 mg/dL (ref 70–99)
Potassium: 4.5 mmol/L (ref 3.5–5.2)
Sodium: 141 mmol/L (ref 134–144)
Total Protein: 6.8 g/dL (ref 6.0–8.5)
eGFR: 65 mL/min/{1.73_m2} (ref >59)

## 2021-10-26 ENCOUNTER — Other Ambulatory Visit: Payer: Self-pay

## 2021-10-26 DIAGNOSIS — Z23 Encounter for immunization: Secondary | ICD-10-CM | POA: Diagnosis not present

## 2021-11-27 DIAGNOSIS — Z1231 Encounter for screening mammogram for malignant neoplasm of breast: Secondary | ICD-10-CM | POA: Diagnosis not present

## 2022-01-27 ENCOUNTER — Other Ambulatory Visit: Payer: Self-pay | Admitting: Nurse Practitioner

## 2022-01-27 DIAGNOSIS — E039 Hypothyroidism, unspecified: Secondary | ICD-10-CM

## 2022-04-16 ENCOUNTER — Ambulatory Visit (INDEPENDENT_AMBULATORY_CARE_PROVIDER_SITE_OTHER): Payer: BC Managed Care – PPO | Admitting: Nurse Practitioner

## 2022-04-16 VITALS — BP 138/71 | HR 69 | Temp 97.6°F | Resp 20 | Ht 64.0 in | Wt 132.0 lb

## 2022-04-16 DIAGNOSIS — R102 Pelvic and perineal pain: Secondary | ICD-10-CM | POA: Diagnosis not present

## 2022-04-16 DIAGNOSIS — Z0000 Encounter for general adult medical examination without abnormal findings: Secondary | ICD-10-CM

## 2022-04-16 DIAGNOSIS — I10 Essential (primary) hypertension: Secondary | ICD-10-CM | POA: Diagnosis not present

## 2022-04-16 DIAGNOSIS — Z0001 Encounter for general adult medical examination with abnormal findings: Secondary | ICD-10-CM | POA: Diagnosis not present

## 2022-04-16 DIAGNOSIS — E039 Hypothyroidism, unspecified: Secondary | ICD-10-CM

## 2022-04-16 DIAGNOSIS — E785 Hyperlipidemia, unspecified: Secondary | ICD-10-CM

## 2022-04-16 DIAGNOSIS — Z6822 Body mass index (BMI) 22.0-22.9, adult: Secondary | ICD-10-CM

## 2022-04-16 LAB — LIPID PANEL

## 2022-04-16 MED ORDER — LISINOPRIL 10 MG PO TABS: 10.0000 mg | ORAL_TABLET | Freq: Every day | ORAL | 1 refills | Status: DC

## 2022-04-16 MED ORDER — ROSUVASTATIN CALCIUM 10 MG PO TABS: 10.0000 mg | ORAL_TABLET | Freq: Every day | ORAL | 1 refills | Status: DC

## 2022-04-16 MED ORDER — LEVOTHYROXINE SODIUM 75 MCG PO TABS: 75.0000 ug | ORAL_TABLET | Freq: Every day | ORAL | 0 refills | Status: DC

## 2022-04-16 NOTE — Addendum Note (Signed)
Addended by: Chevis Pretty on: 04/16/2022 08:53 AM   Modules accepted: Level of Service

## 2022-04-16 NOTE — Patient Instructions (Signed)
Pelvic Pain, Female Pelvic pain is pain in your lower abdomen, below your belly button and between your hips. The pain may start suddenly (be acute), keep coming back (be recurring), or last a long time (become chronic). Pelvic pain that lasts longer than 6 months is considered chronic. Pelvic pain may affect your: Reproductive organs. Urinary system. Digestive tract. Musculoskeletal system. There are many potential causes of pelvic pain. Sometimes, the pain can be a result of digestive or urinary conditions, strained muscles or ligaments, or reproductive conditions. Sometimes the cause of pelvic pain is not known. Follow these instructions at home:  Take over-the-counter and prescription medicines only as told by your health care provider. Rest as told by your health care provider. Do not have sex if it hurts. Keep a journal of your pelvic pain. Write down: When the pain started. Where the pain is located. What seems to make the pain better or worse, such as food or your monthly period (menstrual cycle). Any symptoms you have along with the pain. Keep all follow-up visits. This is important. Contact a health care provider if: Medicine does not help your pain, or your pain comes back. You have new symptoms. You have abnormal vaginal discharge or bleeding, including bleeding after menopause. You have a fever or chills. You are constipated. You have blood in your urine or stool (feces). You have foul-smelling urine. You feel weak or light-headed. Get help right away if: You have sudden severe pain. Your pain gets steadily worse. You have severe pain along with fever, nausea, vomiting, or excessive sweating. You lose consciousness. These symptoms may represent a serious problem that is an emergency. Do not wait to see if the symptoms will go away. Get medical help right away. Call your local emergency services (911 in the [place] Do not drive yourself to the hospital. Summary Pelvic  pain is pain in your lower abdomen, below your belly button and between your hips. There are many potential causes of pelvic pain. Keep a journal of your pelvic pain. This information is not intended to replace advice given to you by your health care provider. Make sure you discuss any questions you have with your health care provider. Document Revised: 05/17/2020 Document Reviewed: 05/17/2020 Elsevier Patient Education  2023 Elsevier Inc.  

## 2022-04-16 NOTE — Progress Notes (Signed)
Subjective:    Patient ID: Teresa Ayala, female    DOB: 1955-05-16, 67 y.o.   MRN: [id number]   Chief Complaint: Medical Management of Chronic Issues    HPI:  Teresa Ayala is a 67 y.o. who identifies as a female who was assigned female at birth.   Social history: Lives with: her husband Work history: [employer] in today for follow up of the following chronic medical issues:  1. Annual physical exam No pap  2. Primary hypertension No c/o chest pain, sob or headache. BP Readings from Last 3 Encounters:  04/16/22 138/71  10/16/21 127/70  05/09/21 126/72     3. Hyperlipidemia with target LDL less than 100 Does watch diet and walks everyday for exercise. Lab Results  Component Value Date   CHOL 153 10/16/2021   HDL 70 10/16/2021   LDLCALC 71 10/16/2021   TRIG 61 10/16/2021   CHOLHDL 2.2 10/16/2021     4. Acquired hypothyroidism No issues that she is aware of. Lab Results  Component Value Date   TSH 1.760 04/12/2021     5. BMI 22.0-22.9,adult No recent weight changes Wt Readings from Last 3 Encounters:  04/16/22 132 lb (59.9 kg)  10/16/21 131 lb 12.8 oz (59.8 kg)  05/09/21 131 lb (59.4 kg)   BMI Readings from Last 3 Encounters:  04/16/22 22.66 kg/m  10/16/21 22.62 kg/m  05/09/21 22.49 kg/m      New complaints: Right lower pelvis pain. Pain is intermittent. Rates a pain 10/10 at times. Can last 44min to an hour at a time. [facility] could not find anything when she saw them. Vaginal U?S was negative.  No Known Allergies Outpatient Encounter Medications as of 04/16/2022  Medication Sig   levothyroxine (SYNTHROID) 75 MCG tablet TAKE 1 TABLET BY MOUTH EVERY DAY   lisinopril (ZESTRIL) 10 MG tablet Take 1 tablet (10 mg total) by mouth daily.   rosuvastatin (CRESTOR) 10 MG tablet Take 1 tablet (10 mg total) by mouth daily.   lidocaine (XYLOCAINE) 2 % solution Swallow 25mL every 4 hours as needed for sore throat (Patient not taking: Reported on  10/16/2021)   No facility-administered encounter medications on file as of 04/16/2022.    Past Surgical History:  Procedure Laterality Date   CHOLECYSTECTOMY  2000   EXTRACORPOREAL SHOCK WAVE LITHOTRIPSY Left 07/30/2019   Procedure: LEFT EXTRACORPOREAL SHOCK WAVE LITHOTRIPSY (ESWL);  Surgeon: Alexis Frock, MD;  Location: [facility];  Service: Urology;  Laterality: Left;    Family History  Problem Relation Age of Onset   Cancer Father        LUNG   Colon cancer Neg Hx       Controlled substance contract: n/a     Review of Systems  Constitutional:  Negative for diaphoresis.  Eyes:  Negative for pain.  Respiratory:  Negative for shortness of breath.   Cardiovascular:  Negative for chest pain, palpitations and leg swelling.  Gastrointestinal:  Negative for abdominal pain.  Endocrine: Negative for polydipsia.  Skin:  Negative for rash.  Neurological:  Negative for dizziness, weakness and headaches.  Hematological:  Does not bruise/bleed easily.  All other systems reviewed and are negative.      Objective:   Physical Exam Vitals and nursing note reviewed.  Constitutional:      General: She is not in acute distress.    Appearance: Normal appearance. She is well-developed.  HENT:     Head: Normocephalic.     Right  Ear: Tympanic membrane normal.     Left Ear: Tympanic membrane normal.     Nose: Nose normal.     Mouth/Throat:     Mouth: Mucous membranes are moist.  Eyes:     Pupils: Pupils are equal, round, and reactive to light.  Neck:     Vascular: No carotid bruit or JVD.  Cardiovascular:     Rate and Rhythm: Normal rate and regular rhythm.     Heart sounds: Normal heart sounds.  Pulmonary:     Effort: Pulmonary effort is normal. No respiratory distress.     Breath sounds: Normal breath sounds. No wheezing or rales.  Chest:     Chest wall: No tenderness.  Abdominal:     General: Bowel sounds are normal. There is no distension or abdominal  bruit.     Palpations: Abdomen is soft. There is no hepatomegaly, splenomegaly, mass or pulsatile mass.     Tenderness: There is no abdominal tenderness.  Musculoskeletal:        General: Normal range of motion.     Cervical back: Normal range of motion and neck supple.  Lymphadenopathy:     Cervical: No cervical adenopathy.  Skin:    General: Skin is warm and dry.  Neurological:     Mental Status: She is alert and oriented to person, place, and time.     Deep Tendon Reflexes: Reflexes are normal and symmetric.  Psychiatric:        Behavior: Behavior normal.        Thought Content: Thought content normal.        Judgment: Judgment normal.     BP 138/71   Pulse 69   Temp 97.6 F (36.4 C) (Temporal)   Resp 20   Ht 5\' 4"  (1.626 m)   Wt 132 lb (59.9 kg)   SpO2 95%   BMI 22.66 kg/m        Assessment & Plan:  Teresa Ayala comes in today with chief complaint of Medical Management of Chronic Issues   Diagnosis and orders addressed:  1. Annual physical exam - VITAMIN D 25 Hydroxy (Vit-D Deficiency, Fractures)  2. Primary hypertension Low sodium diet - EKG 12-Lead - CBC with Differential/Platelet - CMP14+EGFR - Lipid panel - lisinopril (ZESTRIL) 10 MG tablet; Take 1 tablet (10 mg total) by mouth daily.  Dispense: 90 tablet; Refill: 1  3. Hyperlipidemia with target LDL less than 100 Low fat diet - rosuvastatin (CRESTOR) 10 MG tablet; Take 1 tablet (10 mg total) by mouth daily.  Dispense: 90 tablet; Refill: 1  4. Acquired hypothyroidism Labs pending - Thyroid Panel With TSH - levothyroxine (SYNTHROID) 75 MCG tablet; Take 1 tablet (75 mcg total) by mouth daily.  Dispense: 90 tablet; Refill: 0  5. BMI 22.0-22.9, adult Discussed diet and exercise for person with BMI >25 Will recheck weight in 3-6 months   6. Pelvic pain Will talk after CT is complete - CT Abdomen Pelvis W Contrast; Future   Labs pending Health Maintenance reviewed Diet and exercise  encouraged  Follow up plan: 6 months   Mary-Margaret Hassell Done, FNP

## 2022-04-17 LAB — THYROID PANEL WITH TSH
Free Thyroxine Index: 2.8 (ref 1.2–4.9)
T3 Uptake Ratio: 30 % (ref 24–39)
T4, Total: 9.2 ug/dL (ref 4.5–12.0)
TSH: 0.794 u[IU]/mL (ref 0.450–4.500)

## 2022-04-17 LAB — CMP14+EGFR
ALT: 15 IU/L (ref 0–32)
AST: 22 IU/L (ref 0–40)
Albumin/Globulin Ratio: 1.7 (ref 1.2–2.2)
Albumin: 4.3 g/dL (ref 3.9–4.9)
Alkaline Phosphatase: 64 IU/L (ref 44–121)
BUN/Creatinine Ratio: 13 (ref 12–28)
BUN: 11 mg/dL (ref 8–27)
Bilirubin Total: 0.5 mg/dL (ref 0.0–1.2)
CO2: 22 mmol/L (ref 20–29)
Calcium: 9.3 mg/dL (ref 8.7–10.3)
Chloride: 104 mmol/L (ref 96–106)
Creatinine, Ser: 0.85 mg/dL (ref 0.57–1.00)
Globulin, Total: 2.6 g/dL (ref 1.5–4.5)
Glucose: 96 mg/dL (ref 70–99)
Potassium: 4.3 mmol/L (ref 3.5–5.2)
Sodium: 142 mmol/L (ref 134–144)
Total Protein: 6.9 g/dL (ref 6.0–8.5)
eGFR: 76 mL/min/{1.73_m2} (ref >59)

## 2022-04-17 LAB — LIPID PANEL
Chol/HDL Ratio: 2.2 ratio (ref 0.0–4.4)
Cholesterol, Total: 147 mg/dL (ref 100–199)
HDL: 67 mg/dL (ref >39)
LDL Chol Calc (NIH): 66 mg/dL (ref 0–99)
Triglycerides: 69 mg/dL (ref 0–149)
VLDL Cholesterol Cal: 14 mg/dL (ref 5–40)

## 2022-04-17 LAB — CBC WITH DIFFERENTIAL/PLATELET
Basophils Absolute: 0 10*3/uL (ref 0.0–0.2)
Basos: 0 %
EOS (ABSOLUTE): 0 10*3/uL (ref 0.0–0.4)
Eos: 0 %
Hematocrit: 39.2 % (ref 34.0–46.6)
Hemoglobin: 12.9 g/dL (ref 11.1–15.9)
Immature Grans (Abs): 0 10*3/uL (ref 0.0–0.1)
Immature Granulocytes: 0 %
Lymphocytes Absolute: 1.8 10*3/uL (ref 0.7–3.1)
Lymphs: 23 %
MCH: 31.5 pg (ref 26.6–33.0)
MCHC: 32.9 g/dL (ref 31.5–35.7)
MCV: 96 fL (ref 79–97)
Monocytes Absolute: 0.4 10*3/uL (ref 0.1–0.9)
Monocytes: 5 %
Neutrophils Absolute: 5.8 10*3/uL (ref 1.4–7.0)
Neutrophils: 72 %
Platelets: 205 10*3/uL (ref 150–450)
RBC: 4.09 x10E6/uL (ref 3.77–5.28)
RDW: 12.3 % (ref 11.7–15.4)
WBC: 8 10*3/uL (ref 3.4–10.8)

## 2022-04-17 LAB — VITAMIN D 25 HYDROXY (VIT D DEFICIENCY, FRACTURES): Vit D, 25-Hydroxy: 57 ng/mL (ref 30.0–100.0)

## 2022-05-16 ENCOUNTER — Ambulatory Visit
Admission: RE | Admit: 2022-05-16 | Discharge: 2022-05-16 | Disposition: A | Discharge status: Home / Self Care | Payer: BC Managed Care – PPO | Source: Ambulatory Visit | Attending: Nurse Practitioner | Admitting: Nurse Practitioner

## 2022-05-16 DIAGNOSIS — N958 Other specified menopausal and perimenopausal disorders: Secondary | ICD-10-CM | POA: Diagnosis not present

## 2022-05-16 DIAGNOSIS — R1031 Right lower quadrant pain: Secondary | ICD-10-CM | POA: Diagnosis not present

## 2022-05-16 DIAGNOSIS — R102 Pelvic and perineal pain: Secondary | ICD-10-CM

## 2022-05-16 MED ORDER — IOPAMIDOL (ISOVUE-300) INJECTION 61%
100.0000 mL | Freq: Once | INTRAVENOUS | Status: AC | PRN
  Administered 2022-05-16: 100 mL via INTRAVENOUS

## 2022-05-21 ENCOUNTER — Other Ambulatory Visit: Payer: Self-pay | Admitting: Nurse Practitioner

## 2022-05-21 DIAGNOSIS — R102 Pelvic and perineal pain: Secondary | ICD-10-CM

## 2022-06-15 ENCOUNTER — Ambulatory Visit
Admission: RE | Admit: 2022-06-15 | Discharge: 2022-06-15 | Disposition: A | Discharge status: Home / Self Care | Payer: BC Managed Care – PPO | Source: Ambulatory Visit | Attending: Nurse Practitioner | Admitting: Nurse Practitioner

## 2022-06-15 DIAGNOSIS — R102 Pelvic and perineal pain: Secondary | ICD-10-CM | POA: Diagnosis not present

## 2022-06-15 DIAGNOSIS — N8500 Endometrial hyperplasia, unspecified: Secondary | ICD-10-CM | POA: Diagnosis not present

## 2022-08-16 DIAGNOSIS — R319 Hematuria, unspecified: Secondary | ICD-10-CM | POA: Diagnosis not present

## 2022-08-16 DIAGNOSIS — Z13820 Encounter for screening for osteoporosis: Secondary | ICD-10-CM | POA: Diagnosis not present

## 2022-08-16 DIAGNOSIS — Z01419 Encounter for gynecological examination (general) (routine) without abnormal findings: Secondary | ICD-10-CM | POA: Diagnosis not present

## 2022-08-16 DIAGNOSIS — Z6822 Body mass index (BMI) 22.0-22.9, adult: Secondary | ICD-10-CM | POA: Diagnosis not present

## 2022-09-05 DIAGNOSIS — R319 Hematuria, unspecified: Secondary | ICD-10-CM | POA: Diagnosis not present

## 2022-09-06 DIAGNOSIS — R319 Hematuria, unspecified: Secondary | ICD-10-CM | POA: Diagnosis not present

## 2022-09-06 DIAGNOSIS — R9389 Abnormal findings on diagnostic imaging of other specified body structures: Secondary | ICD-10-CM | POA: Diagnosis not present

## 2022-09-06 DIAGNOSIS — R102 Pelvic and perineal pain: Secondary | ICD-10-CM | POA: Diagnosis not present

## 2022-10-18 ENCOUNTER — Other Ambulatory Visit: Payer: Self-pay | Admitting: Nurse Practitioner

## 2022-10-18 ENCOUNTER — Ambulatory Visit (INDEPENDENT_AMBULATORY_CARE_PROVIDER_SITE_OTHER): Payer: BC Managed Care – PPO | Admitting: Nurse Practitioner

## 2022-10-18 ENCOUNTER — Encounter: Payer: Self-pay | Admitting: Nurse Practitioner

## 2022-10-18 ENCOUNTER — Other Ambulatory Visit: Payer: BC Managed Care – PPO

## 2022-10-18 VITALS — BP 123/75 | HR 80 | Temp 97.5°F | Resp 20 | Ht 64.0 in | Wt 134.0 lb

## 2022-10-18 DIAGNOSIS — E785 Hyperlipidemia, unspecified: Secondary | ICD-10-CM

## 2022-10-18 DIAGNOSIS — I10 Essential (primary) hypertension: Secondary | ICD-10-CM | POA: Diagnosis not present

## 2022-10-18 DIAGNOSIS — Z6829 Body mass index (BMI) 29.0-29.9, adult: Secondary | ICD-10-CM | POA: Diagnosis not present

## 2022-10-18 DIAGNOSIS — E039 Hypothyroidism, unspecified: Secondary | ICD-10-CM | POA: Diagnosis not present

## 2022-10-18 LAB — CMP14+EGFR
ALT: 15 IU/L (ref 0–32)
AST: 22 IU/L (ref 0–40)
Albumin: 4.1 g/dL (ref 3.9–4.9)
Alkaline Phosphatase: 66 IU/L (ref 44–121)
BUN/Creatinine Ratio: 16 (ref 12–28)
BUN: 15 mg/dL (ref 8–27)
Bilirubin Total: 0.4 mg/dL (ref 0.0–1.2)
CO2: 26 mmol/L (ref 20–29)
Calcium: 9.1 mg/dL (ref 8.7–10.3)
Chloride: 103 mmol/L (ref 96–106)
Creatinine, Ser: 0.95 mg/dL (ref 0.57–1.00)
Globulin, Total: 2.5 g/dL (ref 1.5–4.5)
Glucose: 91 mg/dL (ref 70–99)
Potassium: 4.7 mmol/L (ref 3.5–5.2)
Sodium: 140 mmol/L (ref 134–144)
Total Protein: 6.6 g/dL (ref 6.0–8.5)
eGFR: 66 mL/min/{1.73_m2} (ref >59)

## 2022-10-18 LAB — CBC WITH DIFFERENTIAL/PLATELET
Basophils Absolute: 0 10*3/uL (ref 0.0–0.2)
Basos: 0 %
EOS (ABSOLUTE): 0.1 10*3/uL (ref 0.0–0.4)
Eos: 2 %
Hematocrit: 40.2 % (ref 34.0–46.6)
Hemoglobin: 12.7 g/dL (ref 11.1–15.9)
Immature Grans (Abs): 0 10*3/uL (ref 0.0–0.1)
Immature Granulocytes: 0 %
Lymphocytes Absolute: 1.7 10*3/uL (ref 0.7–3.1)
Lymphs: 33 %
MCH: 31.1 pg (ref 26.6–33.0)
MCHC: 31.6 g/dL (ref 31.5–35.7)
MCV: 98 fL — ABNORMAL HIGH (ref 79–97)
Monocytes Absolute: 0.4 10*3/uL (ref 0.1–0.9)
Monocytes: 8 %
Neutrophils Absolute: 3 10*3/uL (ref 1.4–7.0)
Neutrophils: 57 %
Platelets: 210 10*3/uL (ref 150–450)
RBC: 4.09 x10E6/uL (ref 3.77–5.28)
RDW: 12.8 % (ref 11.7–15.4)
WBC: 5.2 10*3/uL (ref 3.4–10.8)

## 2022-10-18 LAB — LIPID PANEL
Chol/HDL Ratio: 2.3 ratio (ref 0.0–4.4)
Cholesterol, Total: 145 mg/dL (ref 100–199)
HDL: 64 mg/dL (ref >39)
LDL Chol Calc (NIH): 69 mg/dL (ref 0–99)
Triglycerides: 55 mg/dL (ref 0–149)
VLDL Cholesterol Cal: 12 mg/dL (ref 5–40)

## 2022-10-18 MED ORDER — ROSUVASTATIN CALCIUM 10 MG PO TABS: 10.0000 mg | ORAL_TABLET | Freq: Every day | ORAL | 1 refills | Status: DC

## 2022-10-18 MED ORDER — LEVOTHYROXINE SODIUM 75 MCG PO TABS: 75.0000 ug | ORAL_TABLET | Freq: Every day | ORAL | 1 refills | Status: DC

## 2022-10-18 MED ORDER — LISINOPRIL 10 MG PO TABS
10.0000 mg | ORAL_TABLET | Freq: Every day | ORAL | 1 refills | Status: DC
Start: 2022-10-18 — End: 2023-04-16

## 2022-10-18 NOTE — Patient Instructions (Signed)
Hemorrhoids Hemorrhoids are swollen veins that may form: In the butt (rectum). These are called internal hemorrhoids. Around the opening of the butt (anus). These are called external hemorrhoids. Most hemorrhoids do not cause very bad problems. They often get better with changes to your lifestyle and what you eat. What are the causes? Having trouble pooping (constipation) or watery poop (diarrhea). Pushing too hard when you poop. Pregnancy. Being very overweight (obese). Sitting for too long. Riding a bike for a long time. Heavy lifting or other things that take a lot of effort. Anal sex. What are the signs or symptoms? Pain. Itching or soreness in the butt. Bleeding from the butt. Leaking poop. Swelling. One or more lumps around the opening of your butt. How is this treated? In most cases, hemorrhoids can be treated at home. You may be told to: Change what you eat. Make changes to your lifestyle. If these treatments do not help, you may need to have a procedure done. Your doctor may need to: Place rubber bands at the bottom of the hemorrhoids to make them fall off. Put medicine into the hemorrhoids to shrink them. Shine a type of light on the hemorrhoids to cause them to fall off. Do surgery to get rid of the hemorrhoids. Follow these instructions at home: Medicines Take over-the-counter and prescription medicines only as told by your doctor. Use creams with medicine in them or medicines that you put in your butt as told by your doctor. Eating and drinking  Eat foods that have a lot of fiber in them. These include whole grains, beans, nuts, fruits, and vegetables. Ask your doctor about taking products that have fiber added to them (fibersupplements). Take in less fat. You can do this by: Eating low-fat dairy products. Eating less red meat. Staying away from processed foods. Drink enough fluid to keep your pee (urine) pale yellow. Managing pain and swelling  Take a  warm-water bath (sitz bath) for 20 minutes to ease pain. Do this 3-4 times a day. You may do this in a bathtub. You may also use a portable sitz bath that fits over the toilet. If told, put ice on the painful area. It may help to use ice between your warm baths. Put ice in a plastic bag. Place a towel between your skin and the bag. Leave the ice on for 20 minutes, 2-3 times a day. If your skin turns bright red, take off the ice right away to prevent skin damage. The risk of damage is higher if you cannot feel pain, heat, or cold. General instructions Exercise. Ask your doctor how much and what kind of exercise is best for you. Go to the bathroom when you need to poop. Do not wait. Try not to push too hard when you poop. Keep your butt dry and clean. Use wet toilet paper or moist towelettes after you poop. Do not sit on the toilet for a long time. Contact a doctor if: You have pain and swelling that do not get better with treatment. You have trouble pooping. You cannot poop. You have pain or swelling outside the area of the hemorrhoids. Get help right away if: You have bleeding from the butt that will not stop. This information is not intended to replace advice given to you by your health care provider. Make sure you discuss any questions you have with your health care provider. Document Revised: 09/20/2021 Document Reviewed: 09/20/2021 Elsevier Patient Education  2024 [employer].

## 2022-10-18 NOTE — Progress Notes (Signed)
Subjective:    Patient ID: Teresa Ayala, female    DOB: Jul 21, 1955, 67 y.o.   MRN: [id number]   Chief Complaint: medical management of chronic issues     HPI:  Teresa Ayala is a 67 y.o. who identifies as a female who was assigned female at birth.   Social history: Lives with: husband Work history: works in [employer] in today for follow up of the following chronic medical issues:  1. Primary hypertension No c/o chest pain, sob or headache. Does not check blood pressure at home. BP Readings from Last 3 Encounters:  04/16/22 138/71  10/16/21 127/70  05/09/21 126/72     2. Hyperlipidemia with target LDL less than 100 Does watch diet and stays very active. Lab Results  Component Value Date   CHOL 147 04/16/2022   HDL 67 04/16/2022   LDLCALC 66 04/16/2022   TRIG 69 04/16/2022   CHOLHDL 2.2 04/16/2022     3. Acquired hypothyroidism No issues that she is awareof. Lab Results  Component Value Date   TSH 0.794 04/16/2022     4. BMI 29.0-29.9,adult No recent weight changes Wt Readings from Last 3 Encounters:  10/18/22 134 lb (60.8 kg)  04/16/22 132 lb (59.9 kg)  10/16/21 131 lb 12.8 oz (59.8 kg)   BMI Readings from Last 3 Encounters:  10/18/22 23.00 kg/m  04/16/22 22.66 kg/m  10/16/21 22.62 kg/m      New complaints: None today  No Known Allergies Outpatient Encounter Medications as of 10/18/2022  Medication Sig   levothyroxine (SYNTHROID) 75 MCG tablet Take 1 tablet (75 mcg total) by mouth daily.   lidocaine (XYLOCAINE) 2 % solution Swallow 5mL every 4 hours as needed for sore throat (Patient not taking: Reported on 10/16/2021)   lisinopril (ZESTRIL) 10 MG tablet Take 1 tablet (10 mg total) by mouth daily.   rosuvastatin (CRESTOR) 10 MG tablet Take 1 tablet (10 mg total) by mouth daily.   No facility-administered encounter medications on file as of 10/18/2022.    Past Surgical History:  Procedure Laterality Date   CHOLECYSTECTOMY  2000    EXTRACORPOREAL SHOCK WAVE LITHOTRIPSY Left 07/30/2019   Procedure: LEFT EXTRACORPOREAL SHOCK WAVE LITHOTRIPSY (ESWL);  Surgeon: Sebastian Ache, MD;  Location: [facility];  Service: Urology;  Laterality: Left;    Family History  Problem Relation Age of Onset   Cancer Father        LUNG   Colon cancer Neg Hx       Controlled substance contract: n/a     Review of Systems  Constitutional:  Negative for diaphoresis.  Eyes:  Negative for pain.  Respiratory:  Negative for shortness of breath.   Cardiovascular:  Negative for chest pain, palpitations and leg swelling.  Gastrointestinal:  Negative for abdominal pain.  Endocrine: Negative for polydipsia.  Skin:  Negative for rash.  Neurological:  Negative for dizziness, weakness and headaches.  Hematological:  Does not bruise/bleed easily.  All other systems reviewed and are negative.      Objective:   Physical Exam Vitals and nursing note reviewed.  Constitutional:      General: She is not in acute distress.    Appearance: Normal appearance. She is well-developed.  HENT:     Head: Normocephalic.     Right Ear: Tympanic membrane normal.     Left Ear: Tympanic membrane normal.     Nose: Nose normal.     Mouth/Throat:     Mouth: Mucous membranes  are moist.  Eyes:     Pupils: Pupils are equal, round, and reactive to light.  Neck:     Vascular: No carotid bruit or JVD.  Cardiovascular:     Rate and Rhythm: Normal rate and regular rhythm.     Heart sounds: Normal heart sounds.  Pulmonary:     Effort: Pulmonary effort is normal. No respiratory distress.     Breath sounds: Normal breath sounds. No wheezing or rales.  Chest:     Chest wall: No tenderness.  Abdominal:     General: Bowel sounds are normal. There is no distension or abdominal bruit.     Palpations: Abdomen is soft. There is no hepatomegaly, splenomegaly, mass or pulsatile mass.     Tenderness: There is no abdominal tenderness.  Musculoskeletal:         General: Normal range of motion.     Cervical back: Normal range of motion and neck supple.  Lymphadenopathy:     Cervical: No cervical adenopathy.  Skin:    General: Skin is warm and dry.  Neurological:     Mental Status: She is alert and oriented to person, place, and time.     Deep Tendon Reflexes: Reflexes are normal and symmetric.  Psychiatric:        Behavior: Behavior normal.        Thought Content: Thought content normal.        Judgment: Judgment normal.     BP 123/75   Pulse 80   Temp (!) 97.5 F (36.4 C) (Temporal)   Resp 20   Ht 5\' 4"  (1.626 m)   Wt 134 lb (60.8 kg)   SpO2 99%   BMI 23.00 kg/m        Assessment & Plan:   Teresa Ayala comes in today with chief complaint of Medical Management of Chronic Issues   Diagnosis and orders addressed:  1. Primary hypertension Low sodium diet - lisinopril (ZESTRIL) 10 MG tablet; Take 1 tablet (10 mg total) by mouth daily.  Dispense: 90 tablet; Refill: 1  2. Hyperlipidemia with target LDL less than 100 Low fat diet - rosuvastatin (CRESTOR) 10 MG tablet; Take 1 tablet (10 mg total) by mouth daily.  Dispense: 90 tablet; Refill: 1  3. Acquired hypothyroidism Labs pending - levothyroxine (SYNTHROID) 75 MCG tablet; Take 1 tablet (75 mcg total) by mouth daily.  Dispense: 90 tablet; Refill: 1  4. BMI 29.0-29.9,adult Discussed diet and exercise for person with BMI >25 Will recheck weight in 3-6 months    Labs pending Health Maintenance reviewed Diet and exercise encouraged  Follow up plan: 6 months   Mary-Margaret Daphine Deutscher, FNP

## 2022-10-25 DIAGNOSIS — Z23 Encounter for immunization: Secondary | ICD-10-CM | POA: Diagnosis not present

## 2022-12-03 DIAGNOSIS — Z1231 Encounter for screening mammogram for malignant neoplasm of breast: Secondary | ICD-10-CM | POA: Diagnosis not present

## 2023-01-11 ENCOUNTER — Encounter: Payer: Self-pay | Admitting: Nurse Practitioner

## 2023-01-30 ENCOUNTER — Encounter: Payer: Self-pay | Admitting: Nurse Practitioner

## 2023-02-21 DIAGNOSIS — N939 Abnormal uterine and vaginal bleeding, unspecified: Secondary | ICD-10-CM | POA: Diagnosis not present

## 2023-02-21 DIAGNOSIS — N8500 Endometrial hyperplasia, unspecified: Secondary | ICD-10-CM | POA: Diagnosis not present

## 2023-04-16 ENCOUNTER — Ambulatory Visit (INDEPENDENT_AMBULATORY_CARE_PROVIDER_SITE_OTHER)

## 2023-04-16 ENCOUNTER — Ambulatory Visit (INDEPENDENT_AMBULATORY_CARE_PROVIDER_SITE_OTHER): Payer: BC Managed Care – PPO | Admitting: Nurse Practitioner

## 2023-04-16 ENCOUNTER — Encounter: Payer: Self-pay | Admitting: Nurse Practitioner

## 2023-04-16 VITALS — BP 123/69 | HR 78 | Ht 64.0 in | Wt 138.0 lb

## 2023-04-16 DIAGNOSIS — E039 Hypothyroidism, unspecified: Secondary | ICD-10-CM | POA: Diagnosis not present

## 2023-04-16 DIAGNOSIS — E785 Hyperlipidemia, unspecified: Secondary | ICD-10-CM | POA: Diagnosis not present

## 2023-04-16 DIAGNOSIS — Z0000 Encounter for general adult medical examination without abnormal findings: Secondary | ICD-10-CM | POA: Diagnosis not present

## 2023-04-16 DIAGNOSIS — Z0001 Encounter for general adult medical examination with abnormal findings: Secondary | ICD-10-CM

## 2023-04-16 DIAGNOSIS — I10 Essential (primary) hypertension: Secondary | ICD-10-CM

## 2023-04-16 DIAGNOSIS — R102 Pelvic and perineal pain: Secondary | ICD-10-CM

## 2023-04-16 DIAGNOSIS — Z6829 Body mass index (BMI) 29.0-29.9, adult: Secondary | ICD-10-CM

## 2023-04-16 LAB — LIPID PANEL

## 2023-04-16 MED ORDER — LEVOTHYROXINE SODIUM 75 MCG PO TABS
75.0000 ug | ORAL_TABLET | Freq: Every day | ORAL | 1 refills | Status: DC
Start: 2023-04-16 — End: 2023-08-19

## 2023-04-16 MED ORDER — LISINOPRIL 10 MG PO TABS: 10.0000 mg | ORAL_TABLET | Freq: Every day | ORAL | 1 refills | Status: DC

## 2023-04-16 MED ORDER — ROSUVASTATIN CALCIUM 10 MG PO TABS: 10.0000 mg | ORAL_TABLET | Freq: Every day | ORAL | 1 refills | Status: DC

## 2023-04-16 NOTE — Progress Notes (Signed)
 Subjective:    Patient ID: Teresa Ayala, female    DOB: [birth year], 68 y.o.   MRN: [id number]   Chief Complaint: annual physical   HPI:  Teresa Ayala is a 68 y.o. who identifies as a female who was assigned female at birth.   Social history: Lives with: her husband Work history: Medical laboratory scientific officer   Comes in today for follow up of the following chronic medical issues:  1. Annual physical exam No pap  2. Primary hypertension No c/o chest pain, sob or headache. BP Readings from Last 3 Encounters:  10/18/22 123/75  04/16/22 138/71  10/16/21 127/70     3. Hyperlipidemia with target LDL less than 100 Does watch diet and walks everyday for exercise. Lab Results  Component Value Date   CHOL 145 10/18/2022   HDL 64 10/18/2022   LDLCALC 69 10/18/2022   TRIG 55 10/18/2022   CHOLHDL 2.3 10/18/2022     4. Acquired hypothyroidism No issues that she is aware of. Lab Results  Component Value Date   TSH 0.794 04/16/2022     5. BMI 22.0-22.9,adult No recent weight changes  Wt Readings from Last 3 Encounters:  04/16/23 138 lb (62.6 kg)  10/18/22 134 lb (60.8 kg)  04/16/22 132 lb (59.9 kg)   BMI Readings from Last 3 Encounters:  04/16/23 23.69 kg/m  10/18/22 23.00 kg/m  04/16/22 22.66 kg/m       New complaints: None today  No Known Allergies Outpatient Encounter Medications as of 04/16/2023  Medication Sig   levothyroxine (SYNTHROID) 75 MCG tablet Take 1 tablet (75 mcg total) by mouth daily.   lisinopril (ZESTRIL) 10 MG tablet Take 1 tablet (10 mg total) by mouth daily.   rosuvastatin (CRESTOR) 10 MG tablet Take 1 tablet (10 mg total) by mouth daily.   No facility-administered encounter medications on file as of 04/16/2023.    Past Surgical History:  Procedure Laterality Date   CHOLECYSTECTOMY  2000   EXTRACORPOREAL SHOCK WAVE LITHOTRIPSY Left 07/30/2019   Procedure: LEFT EXTRACORPOREAL SHOCK WAVE LITHOTRIPSY (ESWL);  Surgeon: Sebastian Ache, MD;  Location:  [facility];  Service: Urology;  Laterality: Left;    Family History  Problem Relation Age of Onset   Cancer Father        LUNG   Colon cancer Neg Hx       Controlled substance contract: n/a     Review of Systems  Constitutional:  Negative for diaphoresis.  Eyes:  Negative for pain.  Respiratory:  Negative for shortness of breath.   Cardiovascular:  Negative for chest pain, palpitations and leg swelling.  Gastrointestinal:  Negative for abdominal pain.  Endocrine: Negative for polydipsia.  Skin:  Negative for rash.  Neurological:  Negative for dizziness, weakness and headaches.  Hematological:  Does not bruise/bleed easily.  All other systems reviewed and are negative.      Objective:   Physical Exam Vitals and nursing note reviewed.  Constitutional:      General: She is not in acute distress.    Appearance: Normal appearance. She is well-developed.  HENT:     Head: Normocephalic.     Right Ear: Tympanic membrane normal.     Left Ear: Tympanic membrane normal.     Nose: Nose normal.     Mouth/Throat:     Mouth: Mucous membranes are moist.  Eyes:     Pupils: Pupils are equal, round, and reactive to light.  Neck:     Vascular: No carotid  bruit or JVD.  Cardiovascular:     Rate and Rhythm: Normal rate and regular rhythm.     Heart sounds: Normal heart sounds.  Pulmonary:     Effort: Pulmonary effort is normal. No respiratory distress.     Breath sounds: Normal breath sounds. No wheezing or rales.  Chest:     Chest wall: No tenderness.  Abdominal:     General: Bowel sounds are normal. There is no distension or abdominal bruit.     Palpations: Abdomen is soft. There is no hepatomegaly, splenomegaly, mass or pulsatile mass.     Tenderness: There is no abdominal tenderness.  Musculoskeletal:        General: Normal range of motion.     Cervical back: Normal range of motion and neck supple.  Lymphadenopathy:     Cervical: No cervical adenopathy.   Skin:    General: Skin is warm and dry.  Neurological:     Mental Status: She is alert and oriented to person, place, and time.     Deep Tendon Reflexes: Reflexes are normal and symmetric.  Psychiatric:        Behavior: Behavior normal.        Thought Content: Thought content normal.        Judgment: Judgment normal.     BP 123/69   Pulse 78   Ht 5\' 4"  (1.626 m)   Wt 138 lb (62.6 kg)   SpO2 99%   BMI 23.69 kg/m         Assessment & Plan:  Teresa Ayala comes in today with chief complaint of medical management of chronic issues    Diagnosis and orders addressed:  1. Annual physical exam - VITAMIN D 25 Hydroxy (Vit-D Deficiency, Fractures)  2. Primary hypertension Low sodium diet - EKG 12-Lead - CBC with Differential/Platelet - CMP14+EGFR - Lipid panel - lisinopril (ZESTRIL) 10 MG tablet; Take 1 tablet (10 mg total) by mouth daily.  Dispense: 90 tablet; Refill: 1  3. Hyperlipidemia with target LDL less than 100 Low fat diet - rosuvastatin (CRESTOR) 10 MG tablet; Take 1 tablet (10 mg total) by mouth daily.  Dispense: 90 tablet; Refill: 1  4. Acquired hypothyroidism Labs pending - Thyroid Panel With TSH - levothyroxine (SYNTHROID) 75 MCG tablet; Take 1 tablet (75 mcg total) by mouth daily.  Dispense: 90 tablet; Refill: 0  5. BMI 22.0-22.9, adult Discussed diet and exercise for person with BMI >25 Will recheck weight in 3-6 months   6. Pelvic pain Will talk after CT is complete - CT Abdomen Pelvis W Contrast; Future   Labs pending Health Maintenance reviewed Diet and exercise encouraged  Follow up plan: 6 months   Mary-Margaret Daphine Deutscher, FNP

## 2023-04-16 NOTE — Patient Instructions (Signed)
 Exercising to Stay Healthy To become healthy and stay healthy, it is recommended that you do moderate-intensity and vigorous-intensity exercise. You can tell that you are exercising at a moderate intensity if your heart starts beating faster and you start breathing faster but can still hold a conversation. You can tell that you are exercising at a vigorous intensity if you are breathing much harder and faster and cannot hold a conversation while exercising. How can exercise benefit me? Exercising regularly is important. It has many health benefits, such as: Improving overall fitness, flexibility, and endurance. Increasing bone density. Helping with weight control. Decreasing body fat. Increasing muscle strength and endurance. Reducing stress and tension, anxiety, depression, or anger. Improving overall health. What guidelines should I follow while exercising? Before you start a new exercise program, talk with your health care provider. Do not exercise so much that you hurt yourself, feel dizzy, or get very short of breath. Wear comfortable clothes and wear shoes with good support. Drink plenty of water while you exercise to prevent dehydration or heat stroke. Work out until your breathing and your heartbeat get faster (moderate intensity). How often should I exercise? Choose an activity that you enjoy, and set realistic goals. Your health care provider can help you make an activity plan that is individually designed and works best for you. Exercise regularly as told by your health care provider. This may include: Doing strength training two times a week, such as: Lifting weights. Using resistance bands. Push-ups. Sit-ups. Yoga. Doing a certain intensity of exercise for a given amount of time. Choose from these options: A total of 150 minutes of moderate-intensity exercise every week. A total of 75 minutes of vigorous-intensity exercise every week. A mix of moderate-intensity and  vigorous-intensity exercise every week. Children, pregnant women, people who have not exercised regularly, people who are overweight, and older adults may need to talk with a health care provider about what activities are safe to perform. If you have a medical condition, be sure to talk with your health care provider before you start a new exercise program. What are some exercise ideas? Moderate-intensity exercise ideas include: Walking 1 mile (1.6 km) in about 15 minutes. Biking. Hiking. Golfing. Dancing. Water aerobics. Vigorous-intensity exercise ideas include: Walking 4.5 miles (7.2 km) or more in about 1 hour. Jogging or running 5 miles (8 km) in about 1 hour. Biking 10 miles (16.1 km) or more in about 1 hour. Lap swimming. Roller-skating or in-line skating. Cross-country skiing. Vigorous competitive sports, such as football, basketball, and soccer. Jumping rope. Aerobic dancing. What are some everyday activities that can help me get exercise? Yard work, such as: Child psychotherapist. Raking and bagging leaves. Washing your car. Pushing a stroller. Shoveling snow. Gardening. Washing windows or floors. How can I be more active in my day-to-day activities? Use stairs instead of an elevator. Take a walk during your lunch break. If you drive, park your car farther away from your work or school. If you take public transportation, get off one stop early and walk the rest of the way. Stand up or walk around during all of your indoor phone calls. Get up, stretch, and walk around every 30 minutes throughout the day. Enjoy exercise with a friend. Support to continue exercising will help you keep a regular routine of activity. Where to find more information You can find more information about exercising to stay healthy from: U.S. Department of Health and Human Services: ThisPath.fi Centers for Disease Control and Prevention (  CDC): FootballExhibition.com.br Summary Exercising regularly is  important. It will improve your overall fitness, flexibility, and endurance. Regular exercise will also improve your overall health. It can help you control your weight, reduce stress, and improve your bone density. Do not exercise so much that you hurt yourself, feel dizzy, or get very short of breath. Before you start a new exercise program, talk with your health care provider. This information is not intended to replace advice given to you by your health care provider. Make sure you discuss any questions you have with your health care provider. Document Revised: 05/06/2020 Document Reviewed: 05/06/2020 Elsevier Patient Education  2024 [employer].

## 2023-04-17 LAB — CMP14+EGFR
ALT: 19 IU/L (ref 0–32)
AST: 35 IU/L (ref 0–40)
Albumin: 4.5 g/dL (ref 3.9–4.9)
Alkaline Phosphatase: 77 IU/L (ref 44–121)
BUN/Creatinine Ratio: 20 (ref 12–28)
BUN: 19 mg/dL (ref 8–27)
Bilirubin Total: 0.4 mg/dL (ref 0.0–1.2)
CO2: 22 mmol/L (ref 20–29)
Calcium: 9.2 mg/dL (ref 8.7–10.3)
Chloride: 103 mmol/L (ref 96–106)
Creatinine, Ser: 0.97 mg/dL (ref 0.57–1.00)
Globulin, Total: 2.6 g/dL (ref 1.5–4.5)
Glucose: 91 mg/dL (ref 70–99)
Potassium: 4.3 mmol/L (ref 3.5–5.2)
Sodium: 141 mmol/L (ref 134–144)
Total Protein: 7.1 g/dL (ref 6.0–8.5)
eGFR: 64 mL/min/{1.73_m2} (ref >59)

## 2023-04-17 LAB — CBC WITH DIFFERENTIAL/PLATELET
Basophils Absolute: 0 10*3/uL (ref 0.0–0.2)
Basos: 1 %
EOS (ABSOLUTE): 0.1 10*3/uL (ref 0.0–0.4)
Eos: 1 %
Hematocrit: 40.1 % (ref 34.0–46.6)
Hemoglobin: 13.1 g/dL (ref 11.1–15.9)
Immature Grans (Abs): 0 10*3/uL (ref 0.0–0.1)
Immature Granulocytes: 0 %
Lymphocytes Absolute: 1.8 10*3/uL (ref 0.7–3.1)
Lymphs: 24 %
MCH: 31.8 pg (ref 26.6–33.0)
MCHC: 32.7 g/dL (ref 31.5–35.7)
MCV: 97 fL (ref 79–97)
Monocytes Absolute: 0.4 10*3/uL (ref 0.1–0.9)
Monocytes: 6 %
Neutrophils Absolute: 4.9 10*3/uL (ref 1.4–7.0)
Neutrophils: 68 %
Platelets: 213 10*3/uL (ref 150–450)
RBC: 4.12 x10E6/uL (ref 3.77–5.28)
RDW: 12.4 % (ref 11.7–15.4)
WBC: 7.3 10*3/uL (ref 3.4–10.8)

## 2023-04-17 LAB — LIPID PANEL
Cholesterol, Total: 136 mg/dL (ref 100–199)
HDL: 60 mg/dL (ref >39)
LDL CALC COMMENT:: 2.3 ratio (ref 0.0–4.4)
LDL Chol Calc (NIH): 63 mg/dL (ref 0–99)
Triglycerides: 65 mg/dL (ref 0–149)
VLDL Cholesterol Cal: 13 mg/dL (ref 5–40)

## 2023-04-17 LAB — THYROID PANEL WITH TSH
Free Thyroxine Index: 2.9 (ref 1.2–4.9)
T3 Uptake Ratio: 31 % (ref 24–39)
T4, Total: 9.3 ug/dL (ref 4.5–12.0)
TSH: 1.2 u[IU]/mL (ref 0.450–4.500)

## 2023-04-17 LAB — VITAMIN D 25 HYDROXY (VIT D DEFICIENCY, FRACTURES): Vit D, 25-Hydroxy: 68.6 ng/mL (ref 30.0–100.0)

## 2023-06-25 ENCOUNTER — Ambulatory Visit: Admitting: Physician Assistant

## 2023-06-25 ENCOUNTER — Encounter: Payer: Self-pay | Admitting: Physician Assistant

## 2023-06-25 VITALS — BP 140/78 | HR 93 | Ht 64.0 in | Wt 139.1 lb

## 2023-06-25 DIAGNOSIS — G8929 Other chronic pain: Secondary | ICD-10-CM | POA: Diagnosis not present

## 2023-06-25 DIAGNOSIS — R1031 Right lower quadrant pain: Secondary | ICD-10-CM | POA: Diagnosis not present

## 2023-06-25 NOTE — Patient Instructions (Addendum)
 You have been scheduled for a CT scan of the abdomen and pelvis at [facility], 1st floor Radiology. You are scheduled on Thursday 07/04/23 at 2 pm. You should arrive 15 minutes prior to your appointment time for registration.    Please follow the written instructions below on the day of your exam:   1) Do not eat anything after 12 pm (4 hours prior to your test)   You may take any medications as prescribed with a small amount of water, if necessary. If you take any of the following medications: METFORMIN, GLUCOPHAGE, GLUCOVANCE, AVANDAMET, RIOMET, FORTAMET, ACTOPLUS MET, JANUMET, GLUMETZA or METAGLIP, you MAY be asked to HOLD this medication 48 hours AFTER the exam.   The purpose of you drinking the oral contrast is to aid in the visualization of your intestinal tract. The contrast solution may cause some diarrhea. Depending on your individual set of symptoms, you may also receive an intravenous injection of x-ray contrast/dye. Plan on being at [facility] for 45 minutes or longer, depending on the type of exam you are having performed.   If you have any questions regarding your exam or if you need to reschedule, you may call [facility] at [phone] between the hours of 8:00 am and 5:00 pm, Monday-Friday.   _______________________________________________________  If your blood pressure at your visit was 140/90 or greater, please contact your primary care physician to follow up on this.  _______________________________________________________  If you are age 63 or older, your body mass index should be between 23-30. Your Body mass index is 23.88 kg/m. If this is out of the aforementioned range listed, please consider follow up with your Primary Care Provider.  If you are age 38 or younger, your body mass index should be between 19-25. Your Body mass index is 23.88 kg/m. If this is out of the aformentioned range listed, please consider follow up with your Primary Care  Provider.   ________________________________________________________  The [facility] providers would like to encourage you to use MYCHART to communicate with providers for non-urgent requests or questions.  Due to long hold times on the telephone, sending your provider a message by [facility] may be a faster and more efficient way to get a response.  Please allow 48 business hours for a response.  Please remember that this is for non-urgent requests.  _______________________________________________________

## 2023-06-25 NOTE — Progress Notes (Signed)
 Chief Complaint: Right lower quadrant pain  HPI:    Mrs. Fini is a 68 year old Caucasian female with a past medical history as listed below including hypertension and thyroid  disease, known to Dr. Willy Harvest, who was referred to me by Gaylyn Keas, Mary-Margaret, * for a complaint of lower quadrant pain.      05/23/2015 colonoscopy for screening was normal.  Repeat recommended 10 years.    05/21/2022 CTAP with contrast for pelvic pain with questionable but not definite endometrial thickening, small fat-containing supraumbilical ventral abdominal wall hernia and moderate colonic stool burden.    06/15/2022 pelvic ultrasound with thickened endometrium measuring 7 mm.    January 2025 patient underwent endometrial biopsy which was negative/normal.    Today, the patient tells me that since 2022 she has had some right lower quadrant discomfort, initially this was across her abdomen and even suprapubic, she had evaluation as above and finished with biopsy.  Tells me that this pain has continued off-and-on, was once every 2 months.  Describing this pain as a cramping/stabbing pain.  She would take 2 Tylenol and it would be gone within 10 minutes.  It now occurs maybe 2 times a month.  She cannot correlate with eating, bowel movement, movement or other.  It just occurs out of the blue.  Again if she takes Tylenol it goes away.  No other GI symptoms.  Bowel movements are normal per her, no experience constipation other than every once in a while, no weight loss or blood in her stool.    Denies fever, chills, weight loss, nausea, vomiting or symptoms that awaken her from sleep.  Past Medical History:  Diagnosis Date   Hypertension    Thyroid  disease     Past Surgical History:  Procedure Laterality Date   CHOLECYSTECTOMY  2000   EXTRACORPOREAL SHOCK WAVE LITHOTRIPSY Left 07/30/2019   Procedure: LEFT EXTRACORPOREAL SHOCK WAVE LITHOTRIPSY (ESWL);  Surgeon: Osborn Blaze, MD;  Location: [facility];   Service: Urology;  Laterality: Left;    Current Outpatient Medications  Medication Sig Dispense Refill   levothyroxine  (SYNTHROID ) 75 MCG tablet Take 1 tablet (75 mcg total) by mouth daily. 90 tablet 1   lisinopril  (ZESTRIL ) 10 MG tablet Take 1 tablet (10 mg total) by mouth daily. 90 tablet 1   rosuvastatin  (CRESTOR ) 10 MG tablet Take 1 tablet (10 mg total) by mouth daily. 90 tablet 1   No current facility-administered medications for this visit.    Allergies as of 06/25/2023   (No Known Allergies)    Family History  Problem Relation Age of Onset   Cancer Father        LUNG   Colon cancer Neg Hx     Social History   Socioeconomic History   Marital status: Married    Spouse name: Not on file   Number of children: Not on file   Years of education: Not on file   Highest education level: Associate degree: occupational, Scientist, product/process development, or vocational program  Occupational History   Not on file  Tobacco Use   Smoking status: Never   Smokeless tobacco: Never  Substance and Sexual Activity   Alcohol use: Yes    Comment: drinks on weekends   Drug use: No   Sexual activity: Not on file  Other Topics Concern   Not on file  Social History Narrative   Not on file   Social Drivers of Health   Financial Resource Strain: Low Risk  (04/15/2023)   Overall Financial  Resource Strain (CARDIA)    Difficulty of Paying Living Expenses: Not hard at all  Food Insecurity: No Food Insecurity (04/15/2023)   Hunger Vital Sign    Worried About Running Out of Food in the Last Year: Never true    Ran Out of Food in the Last Year: Never true  Transportation Needs: No Transportation Needs (04/15/2023)   PRAPARE - Administrator, Civil Service (Medical): No    Lack of Transportation (Non-Medical): No  Physical Activity: Sufficiently Active (04/15/2023)   Exercise Vital Sign    Days of Exercise per Week: 5 days    Minutes of Exercise per Session: 30 min  Stress: No Stress Concern Present  (04/15/2023)   [employer] of Occupational Health - Occupational Stress Questionnaire    Feeling of Stress : Only a little  Social Connections: Socially Integrated (04/15/2023)   Social Connection and Isolation Panel [NHANES]    Frequency of Communication with Friends and Family: More than three times a week    Frequency of Social Gatherings with Friends and Family: Three times a week    Attends Religious Services: More than 4 times per year    Active Member of Clubs or Organizations: Yes    Attends Banker Meetings: 1 to 4 times per year    Marital Status: Married  Catering manager Violence: Not on file    Review of Systems:    Constitutional: No weight loss, fever or chills Skin: No rash  Cardiovascular: No chest pain Respiratory: No SOB  Gastrointestinal: See HPI and otherwise negative Genitourinary: No dysuria Neurological: No headache, dizziness or syncope Musculoskeletal: No new muscle or joint pain Hematologic: No bleeding Psychiatric: No history of depression or anxiety   Physical Exam:  Vital signs: BP (!) 140/78   Pulse 93   Ht 5\' 4"  (1.626 m)   Wt 139 lb 1.6 oz (63.1 kg)   BMI 23.88 kg/m    Constitutional:   Pleasant Caucasian female appears to be in NAD, Well developed, Well nourished, alert and cooperative Head:  Normocephalic and atraumatic. Eyes:   PEERL, EOMI. No icterus. Conjunctiva pink. Ears:  Normal auditory acuity. Neck:  Supple Throat: Oral cavity and pharynx without inflammation, swelling or lesion.  Respiratory: Respirations even and unlabored. Lungs clear to auscultation bilaterally.   No wheezes, crackles, or rhonchi.  Cardiovascular: Normal S1, S2. No MRG. Regular rate and rhythm. No peripheral edema, cyanosis or pallor.  Gastrointestinal:  Soft, nondistended, nontender. No rebound or guarding. Normal bowel sounds. No appreciable masses or hepatomegaly. Rectal:  Not performed.  Msk:  Symmetrical without gross deformities.  Without edema, no deformity or joint abnormality.  Neurologic:  Alert and  oriented x4;  grossly normal neurologically.  Skin:   Dry and intact without significant lesions or rashes. Psychiatric: Demonstrates good judgement and reason without abnormal affect or behaviors.  RELEVANT LABS AND IMAGING: CBC    Component Value Date/Time   WBC 7.3 04/16/2023 0856   RBC 4.12 04/16/2023 0856   HGB 13.1 04/16/2023 0856   HCT 40.1 04/16/2023 0856   PLT 213 04/16/2023 0856   MCV 97 04/16/2023 0856   MCH 31.8 04/16/2023 0856   MCHC 32.7 04/16/2023 0856   RDW 12.4 04/16/2023 0856   LYMPHSABS 1.8 04/16/2023 0856   EOSABS 0.1 04/16/2023 0856   BASOSABS 0.0 04/16/2023 0856    CMP     Component Value Date/Time   NA 141 04/16/2023 0856   K 4.3 04/16/2023 0856  CL 103 04/16/2023 0856   CO2 22 04/16/2023 0856   GLUCOSE 91 04/16/2023 0856   BUN 19 04/16/2023 0856   CREATININE 0.97 04/16/2023 0856   CALCIUM  9.2 04/16/2023 0856   PROT 7.1 04/16/2023 0856   ALBUMIN 4.5 04/16/2023 0856   AST 35 04/16/2023 0856   ALT 19 04/16/2023 0856   ALKPHOS 77 04/16/2023 0856   BILITOT 0.4 04/16/2023 0856   GFRNONAA 60 10/12/2019 0844   GFRAA 69 10/12/2019 0844    Assessment: 1.  Right lower quadrant pain: Chronic at this point over the past 3 years, initial evaluation by OB/GYN was negative, last CT in April 2024 with thickened endometrium, has continued with pain maybe twice a month that last for 10 minutes before she takes 2 Tylenol and it goes away, no correlation with activity eating or bowel movements, up-to-date with colonoscopy, the last in 2017 with repeat recommended 10 years; consider nerve pain versus gas versus possible constipation (this is seen on CT)  Plan: 1.  At this point we will start with repeat CT to compare to prior.  Ordered CT of the abdomen pelvis with contrast.  Ordered BMP for kidney function. 2.  Patient can continue Tylenol as this seems to help. 3.  There are no alarm  features of this discomfort.  Discussed this with her today and tried to reassure her.  It could be related to occasional constipation, though she cannot correlate this.  Could consider starting MiraLAX daily 4.  If CT is normal could consider earlier colonoscopy for further evaluation versus MRI. 5.  Patient to follow in clinic with us  per recommendations after CT above.  Reginal Capra, PA-C [facility] 06/25/2023, 8:26 AM  Cc: Delfina Feller, *

## 2023-07-04 ENCOUNTER — Ambulatory Visit (HOSPITAL_COMMUNITY)
Admission: RE | Admit: 2023-07-04 | Discharge: 2023-07-04 | Disposition: A | Discharge status: Home / Self Care | Source: Ambulatory Visit | Attending: Physician Assistant | Admitting: Physician Assistant

## 2023-07-04 DIAGNOSIS — K769 Liver disease, unspecified: Secondary | ICD-10-CM | POA: Diagnosis not present

## 2023-07-04 DIAGNOSIS — R1031 Right lower quadrant pain: Secondary | ICD-10-CM | POA: Insufficient documentation

## 2023-07-04 MED ORDER — IOHEXOL 300 MG/ML  SOLN
100.0000 mL | Freq: Once | INTRAMUSCULAR | Status: AC | PRN
Start: 2023-07-04 — End: 2023-07-04
  Administered 2023-07-04: 100 mL via INTRAVENOUS

## 2023-07-04 MED ORDER — IOHEXOL 9 MG/ML PO SOLN
500.0000 mL | ORAL | Status: AC
  Administered 2023-07-04 (×2): 500 mL via ORAL

## 2023-07-04 MED ORDER — SODIUM CHLORIDE (PF) 0.9 % IJ SOLN
INTRAMUSCULAR | Status: AC
  Filled 2023-07-04: qty 50

## 2023-07-08 ENCOUNTER — Ambulatory Visit: Payer: Self-pay | Admitting: Physician Assistant

## 2023-07-09 ENCOUNTER — Other Ambulatory Visit: Payer: Self-pay

## 2023-07-09 DIAGNOSIS — R1031 Right lower quadrant pain: Secondary | ICD-10-CM

## 2023-08-06 DIAGNOSIS — K080 Exfoliation of teeth due to systemic causes: Secondary | ICD-10-CM | POA: Diagnosis not present

## 2023-08-19 ENCOUNTER — Other Ambulatory Visit: Payer: Self-pay

## 2023-08-19 ENCOUNTER — Telehealth: Payer: Self-pay | Admitting: Physician Assistant

## 2023-08-19 DIAGNOSIS — I10 Essential (primary) hypertension: Secondary | ICD-10-CM

## 2023-08-19 DIAGNOSIS — E039 Hypothyroidism, unspecified: Secondary | ICD-10-CM

## 2023-08-19 MED ORDER — LISINOPRIL 10 MG PO TABS: 10.0000 mg | ORAL_TABLET | Freq: Every day | ORAL | 0 refills | Status: DC

## 2023-08-19 MED ORDER — LEVOTHYROXINE SODIUM 75 MCG PO TABS: 75.0000 ug | ORAL_TABLET | Freq: Every day | ORAL | 0 refills | Status: DC

## 2023-08-19 NOTE — Telephone Encounter (Signed)
 MRI Abdomen  Case is pending and is awaiting a peer to peer, I was informed any clinical person, nurse,pa, or provider can call to further discuss this case.   Peer to peer [phone] Opt 1 Case#: [id number]  Enter DOB [birth year]   Press 1     Say Representative

## 2023-08-19 NOTE — Telephone Encounter (Signed)
 Look under the Media tab. It appears the MRI has been approved. Is that what you see?

## 2023-08-20 ENCOUNTER — Ambulatory Visit (HOSPITAL_COMMUNITY): Admission: RE | Admit: 2023-08-20 | Source: Ambulatory Visit

## 2023-08-20 ENCOUNTER — Encounter (HOSPITAL_COMMUNITY): Payer: Self-pay

## 2023-08-20 ENCOUNTER — Ambulatory Visit (HOSPITAL_COMMUNITY)

## 2023-08-21 ENCOUNTER — Telehealth: Payer: Self-pay | Admitting: Physician Assistant

## 2023-08-21 NOTE — Telephone Encounter (Signed)
 Are both approved now? The both have the green checks by the appointments. Thanks

## 2023-08-21 NOTE — Telephone Encounter (Signed)
 Telephone call  08/21/2023 11:20 AM  Called the patient to let her know that her insurance did not approve the MRI of the abdomen.  I again reviewed CT recently, there is nothing concerning here to follow-up on.  There is benign lesion in her liver which does not need further follow up.  She has MRI of the pelvis for her chronic RLQ pain scheduled for Friday.   She verbalized understanding and thanked me for the call.  JLL

## 2023-08-23 ENCOUNTER — Ambulatory Visit (HOSPITAL_COMMUNITY)

## 2023-08-23 ENCOUNTER — Encounter (HOSPITAL_COMMUNITY): Payer: Self-pay

## 2023-09-03 ENCOUNTER — Ambulatory Visit (HOSPITAL_COMMUNITY)
Admission: RE | Admit: 2023-09-03 | Discharge: 2023-09-03 | Disposition: A | Discharge status: Home / Self Care | Source: Ambulatory Visit | Attending: Physician Assistant | Admitting: Physician Assistant

## 2023-09-03 ENCOUNTER — Ambulatory Visit (HOSPITAL_COMMUNITY)
Admission: RE | Admit: 2023-09-03 | Discharge: 2023-09-03 | Disposition: A | Discharge status: Home / Self Care | Source: Ambulatory Visit | Attending: Physician Assistant

## 2023-09-03 DIAGNOSIS — G8929 Other chronic pain: Secondary | ICD-10-CM | POA: Diagnosis not present

## 2023-09-03 DIAGNOSIS — R1031 Right lower quadrant pain: Secondary | ICD-10-CM | POA: Diagnosis not present

## 2023-09-03 DIAGNOSIS — K769 Liver disease, unspecified: Secondary | ICD-10-CM | POA: Diagnosis not present

## 2023-09-03 DIAGNOSIS — N281 Cyst of kidney, acquired: Secondary | ICD-10-CM | POA: Diagnosis not present

## 2023-09-03 DIAGNOSIS — R109 Unspecified abdominal pain: Secondary | ICD-10-CM | POA: Diagnosis not present

## 2023-09-03 MED ORDER — GADOBUTROL 1 MMOL/ML IV SOLN
6.0000 mL | Freq: Once | INTRAVENOUS | Status: AC | PRN
  Administered 2023-09-03 (×2): 6 mL via INTRAVENOUS

## 2023-09-09 ENCOUNTER — Ambulatory Visit: Payer: Self-pay | Admitting: Physician Assistant

## 2023-09-19 ENCOUNTER — Telehealth: Admitting: Physician Assistant

## 2023-09-19 DIAGNOSIS — L237 Allergic contact dermatitis due to plants, except food: Secondary | ICD-10-CM | POA: Diagnosis not present

## 2023-09-19 MED ORDER — PREDNISONE 10 MG PO TABS: ORAL_TABLET | ORAL | 0 refills | Status: AC

## 2023-09-19 MED ORDER — TRIAMCINOLONE ACETONIDE 0.1 % EX CREA: 1.0000 | TOPICAL_CREAM | Freq: Two times a day (BID) | CUTANEOUS | 0 refills | Status: DC

## 2023-09-19 NOTE — Progress Notes (Signed)
 E-Visit for [employer]  We are sorry that you are not feeing well.  Here is how we plan to help!  Based on what you have shared with me it looks like you have had an allergic reaction to the oily resin from a group of plants.  This resin is very sticky, so it easily attaches to your skin, clothing, tools equipment, and pet's fur.    This blistering rash is often called poison ivy rash although it can come from contact with the leaves, stems and roots of poison ivy, poison oak and poison sumac.  The oily resin contains urushiol (u-ROO-she-ol) that produces a skin rash on exposed skin.  The severity of the rash depends on the amount of urushiol that gets on your skin.  A section of skin with more urushiol on it may develop a rash sooner.  The rash usually develops 12-48 hours after exposure and can last two to three weeks.  Your skin must come in direct contact with the plant's oil to be affected.  Blister fluid doesn't spread the rash.  However, if you come into contact with a piece of clothing or pet fur that has urushiol on it, the rash may spread out.  You can also transfer the oil to other parts of your body with your fingers.  Often the rash looks like a straight line because of the way the plant brushes against your skin.  Since your rash is widespread or has resulted in a large number of blisters, I have prescribed an oral corticosteroid.  Please follow these recommendations:  I have sent a prednisone  dose pack to your chosen pharmacy. Be sure to follow the instructions carefully and complete the entire prescription. You may use Benadryl  or Caladryl topical lotions to sooth the itch and remember cool, not hot, showers and baths can help relieve the itching!  Place cool, wet compresses on the affected area for 15-30 minutes several times a day.  You may also take oral antihistamines, such as diphenhydramine  (Benadryl , others), which may also help you sleep better.  Watch your skin for any purulent  (pus) drainage or red streaking from the site.  If this occurs, contact your provider.  You may require an antibiotic for a skin infection.  Make sure that the clothes you were wearing as well as any towels or sheets that may have come in contact with the oil (urushiol) are washed in detergent and hot water.       I have developed the following plan to treat your condition I am prescribing a two week course of steroids (37 tablets of 10 mg prednisone ).  Days 1-4 take 4 tablets (40 mg) daily  Days 5-8 take 3 tablets (30 mg) daily, Days 9-11 take 2 tablets (20 mg) daily, Days 12-14 take 1 tablet (10 mg) daily.  and I am prescribing triamcinolone  0.1 % cream -- apply to the affected area(s) in a thin layer, twice daily for up to 14 days. Do not apply to face, privates or armpit regions.    What can you do to prevent this rash?  Avoid the plants.  Learn how to identify poison ivy, poison oak and poison sumac in all seasons.  When hiking or engaging in other activities that might expose you to these plants, try to stay on cleared pathways.  If camping, make sure you pitch your tent in an area free of these plants.  Keep pets from running through wooded areas so that urushiol doesn't  accidentally stick to their fur, which you may touch.  Remove or kill the plants.  In your yard, you can get rid of poison ivy by applying an herbicide or pulling it out of the ground, including the roots, while wearing heavy gloves.  Afterward remove the gloves and thoroughly wash them and your hands.  Don't burn poison ivy or related plants because the urushiol can be carried by smoke.  Wear protective clothing.  If needed, protect your skin by wearing socks, boots, pants, long sleeves and vinyl gloves.  Wash your skin right away.  Washing off the oil with soap and water within 30 minutes of exposure may reduce your chances of getting a poison ivy rash.  Even washing after an hour or so can help reduce the severity of the  rash.  If you walk through some poison ivy and then later touch your shoes, you may get some urushiol on your hands, which may then transfer to your face or body by touching or rubbing.  If the contaminated object isn't cleaned, the urushiol on it can still cause a skin reaction years later.    Be careful not to reuse towels after you have washed your skin.  Also carefully wash clothing in detergent and hot water to remove all traces of the oil.  Handle contaminated clothing carefully so you don't transfer the urushiol to yourself, furniture, rugs or appliances.  Remember that pets can carry the oil on their fur and paws.  If you think your pet may be contaminated with urushiol, put on some long rubber gloves and give your pet a bath.  Finally, be careful not to burn these plants as the smoke can contain traces of the oil.  Inhaling the smoke may result in difficulty breathing. If that occurred you should see a physician as soon as possible.  See your doctor right away if:  The reaction is severe or widespread You inhaled the smoke from burning poison ivy and are having difficulty breathing Your skin continues to swell The rash affects your eyes, mouth or genitals Blisters are oozing pus You develop a fever greater than 100 F (37.8 C) The rash doesn't get better within a few weeks.  If you scratch the poison ivy rash, bacteria under your fingernails may cause the skin to become infected.  See your doctor if pus starts oozing from the blisters.  Treatment generally includes antibiotics.  Poison ivy treatments are usually limited to self-care methods.  And the rash typically goes away on its own in two to three weeks.     If the rash is widespread or results in a large number of blisters, your doctor may prescribe an oral corticosteroid, such as prednisone .  If a bacterial infection has developed at the rash site, your doctor may give you a prescription for an oral antibiotic.  MAKE SURE  YOU  Understand these instructions. Will watch your condition. Will get help right away if you are not doing well or get worse.   Thank you for choosing an e-visit.  Your e-visit answers were reviewed by a board certified advanced clinical practitioner to complete your personal care plan. Depending upon the condition, your plan could have included both over the counter or prescription medications.  Please review your pharmacy choice. Make sure the pharmacy is open so you can pick up prescription now. If there is a problem, you may contact your provider through [employer] and have the prescription routed to another pharmacy.  Your safety is important to us . If you have drug allergies check your prescription carefully.   For the next 24 hours you can use MyChart to ask questions about today's visit, request a non-urgent call back, or ask for a work or school excuse. You will get an email in the next two days asking about your experience. I hope that your e-visit has been valuable and will speed your recovery.

## 2023-09-19 NOTE — Progress Notes (Signed)
 I have spent 5 minutes in review of e-visit questionnaire, review and updating patient chart, medical decision making and response to patient.   Elsie Velma Lunger, PA-C

## 2023-09-25 DIAGNOSIS — Z01419 Encounter for gynecological examination (general) (routine) without abnormal findings: Secondary | ICD-10-CM | POA: Diagnosis not present

## 2023-09-25 DIAGNOSIS — Z6823 Body mass index (BMI) 23.0-23.9, adult: Secondary | ICD-10-CM | POA: Diagnosis not present

## 2023-10-17 ENCOUNTER — Encounter: Payer: Self-pay | Admitting: Nurse Practitioner

## 2023-10-17 ENCOUNTER — Ambulatory Visit (INDEPENDENT_AMBULATORY_CARE_PROVIDER_SITE_OTHER): Admitting: Nurse Practitioner

## 2023-10-17 VITALS — BP 132/77 | HR 73 | Temp 97.8°F | Ht 64.0 in | Wt 134.0 lb

## 2023-10-17 DIAGNOSIS — E785 Hyperlipidemia, unspecified: Secondary | ICD-10-CM

## 2023-10-17 DIAGNOSIS — Z6829 Body mass index (BMI) 29.0-29.9, adult: Secondary | ICD-10-CM

## 2023-10-17 DIAGNOSIS — E039 Hypothyroidism, unspecified: Secondary | ICD-10-CM | POA: Diagnosis not present

## 2023-10-17 DIAGNOSIS — I10 Essential (primary) hypertension: Secondary | ICD-10-CM

## 2023-10-17 LAB — LIPID PANEL

## 2023-10-17 NOTE — Patient Instructions (Signed)

## 2023-10-17 NOTE — Progress Notes (Signed)
 Subjective:    Patient ID: Teresa Ayala, female    DOB: [birth year], 68 y.o.   MRN: [id number]   Chief Complaint: medical management of chronic issues     HPI:  Teresa Ayala is a 68 y.o. who identifies as a female who was assigned female at birth.   Social history: Lives with: husband Work history: works in [employer] in today for follow up of the following chronic medical issues:  1. Primary hypertension No c/o chest pain, sob or headache. Does not check blood pressure at home. BP Readings from Last 3 Encounters:  06/25/23 (!) 140/78  04/16/23 123/69  10/18/22 123/75     2. Hyperlipidemia with target LDL less than 100 Does watch diet and stays very active. Lab Results  Component Value Date   CHOL 136 04/16/2023   HDL 60 04/16/2023   LDLCALC 63 04/16/2023   TRIG 65 04/16/2023   CHOLHDL 2.3 04/16/2023     3. Acquired hypothyroidism No issues that she is awareof. Lab Results  Component Value Date   TSH 1.200 04/16/2023     4. BMI 29.0-29.9,adult Weight is down 5 lbs  Wt Readings from Last 3 Encounters:  10/17/23 134 lb (60.8 kg)  06/25/23 139 lb 1.6 oz (63.1 kg)  04/16/23 138 lb (62.6 kg)   BMI Readings from Last 3 Encounters:  10/17/23 23.00 kg/m  06/25/23 23.88 kg/m  04/16/23 23.69 kg/m       New complaints: None today  No Known Allergies Outpatient Encounter Medications as of 10/17/2023  Medication Sig   levothyroxine  (SYNTHROID ) 75 MCG tablet Take 1 tablet (75 mcg total) by mouth daily.   lisinopril  (ZESTRIL ) 10 MG tablet Take 1 tablet (10 mg total) by mouth daily.   rosuvastatin  (CRESTOR ) 10 MG tablet Take 1 tablet (10 mg total) by mouth daily.   triamcinolone  cream (KENALOG ) 0.1 % Apply 1 Application topically 2 (two) times daily.   No facility-administered encounter medications on file as of 10/17/2023.    Past Surgical History:  Procedure Laterality Date   CHOLECYSTECTOMY  2000   EXTRACORPOREAL SHOCK WAVE LITHOTRIPSY Left  07/30/2019   Procedure: LEFT EXTRACORPOREAL SHOCK WAVE LITHOTRIPSY (ESWL);  Surgeon: Alvaro Hummer, MD;  Location: [facility];  Service: Urology;  Laterality: Left;    Family History  Problem Relation Age of Onset   Cancer Father        LUNG   Colon cancer Neg Hx       Controlled substance contract: n/a     Review of Systems  Constitutional:  Negative for diaphoresis.  Eyes:  Negative for pain.  Respiratory:  Negative for shortness of breath.   Cardiovascular:  Negative for chest pain, palpitations and leg swelling.  Gastrointestinal:  Negative for abdominal pain.  Endocrine: Negative for polydipsia.  Skin:  Negative for rash.  Neurological:  Negative for dizziness, weakness and headaches.  Hematological:  Does not bruise/bleed easily.  All other systems reviewed and are negative.      Objective:   Physical Exam Vitals and nursing note reviewed.  Constitutional:      General: She is not in acute distress.    Appearance: Normal appearance. She is well-developed.  HENT:     Head: Normocephalic.     Right Ear: Tympanic membrane normal.     Left Ear: Tympanic membrane normal.     Nose: Nose normal.     Mouth/Throat:     Mouth: Mucous membranes are moist.  Eyes:  Pupils: Pupils are equal, round, and reactive to light.  Neck:     Vascular: No carotid bruit or JVD.  Cardiovascular:     Rate and Rhythm: Normal rate and regular rhythm.     Heart sounds: Normal heart sounds.  Pulmonary:     Effort: Pulmonary effort is normal. No respiratory distress.     Breath sounds: Normal breath sounds. No wheezing or rales.  Chest:     Chest wall: No tenderness.  Abdominal:     General: Bowel sounds are normal. There is no distension or abdominal bruit.     Palpations: Abdomen is soft. There is no hepatomegaly, splenomegaly, mass or pulsatile mass.     Tenderness: There is no abdominal tenderness.  Musculoskeletal:        General: Normal range of motion.      Cervical back: Normal range of motion and neck supple.  Lymphadenopathy:     Cervical: No cervical adenopathy.  Skin:    General: Skin is warm and dry.  Neurological:     Mental Status: She is alert and oriented to person, place, and time.     Deep Tendon Reflexes: Reflexes are normal and symmetric.  Psychiatric:        Behavior: Behavior normal.        Thought Content: Thought content normal.        Judgment: Judgment normal.     BP 132/77   Pulse 73   Temp 97.8 F (36.6 C) (Temporal)   Ht 5' 4 (1.626 m)   Wt 134 lb (60.8 kg)   SpO2 98%   BMI 23.00 kg/m       Assessment & Plan:   Teresa Ayala comes in today with chief complaint of medical management of chronic issues    Diagnosis and orders addressed:  1. Primary hypertension Low sodium diet - lisinopril  (ZESTRIL ) 10 MG tablet; Take 1 tablet (10 mg total) by mouth daily.  Dispense: 90 tablet; Refill: 1  2. Hyperlipidemia with target LDL less than 100 Low fat diet - rosuvastatin  (CRESTOR ) 10 MG tablet; Take 1 tablet (10 mg total) by mouth daily.  Dispense: 90 tablet; Refill: 1  3. Acquired hypothyroidism Labs pending - levothyroxine  (SYNTHROID ) 75 MCG tablet; Take 1 tablet (75 mcg total) by mouth daily.  Dispense: 90 tablet; Refill: 1  4. BMI 29.0-29.9,adult Discussed diet and exercise for person with BMI >25 Will recheck weight in 3-6 months    Labs pending Health Maintenance reviewed Diet and exercise encouraged  Follow up plan: 6 months   Mary-Margaret Gladis, FNP

## 2023-10-18 ENCOUNTER — Ambulatory Visit: Payer: Self-pay | Admitting: Nurse Practitioner

## 2023-10-18 LAB — CBC WITH DIFFERENTIAL/PLATELET
Basophils Absolute: 0 x10E3/uL (ref 0.0–0.2)
Basos: 1 %
EOS (ABSOLUTE): 0.2 x10E3/uL (ref 0.0–0.4)
Eos: 3 %
Hematocrit: 40.7 % (ref 34.0–46.6)
Hemoglobin: 13 g/dL (ref 11.1–15.9)
Immature Grans (Abs): 0 x10E3/uL (ref 0.0–0.1)
Immature Granulocytes: 0 %
Lymphocytes Absolute: 1.6 x10E3/uL (ref 0.7–3.1)
Lymphs: 27 %
MCH: 31.7 pg (ref 26.6–33.0)
MCHC: 31.9 g/dL (ref 31.5–35.7)
MCV: 99 fL — ABNORMAL HIGH (ref 79–97)
Monocytes Absolute: 0.4 x10E3/uL (ref 0.1–0.9)
Monocytes: 8 %
Neutrophils Absolute: 3.6 x10E3/uL (ref 1.4–7.0)
Neutrophils: 61 %
Platelets: 215 x10E3/uL (ref 150–450)
RBC: 4.1 x10E6/uL (ref 3.77–5.28)
RDW: 12.1 % (ref 11.7–15.4)
WBC: 5.8 x10E3/uL (ref 3.4–10.8)

## 2023-10-18 LAB — CMP14+EGFR
ALT: 17 IU/L (ref 0–32)
AST: 22 IU/L (ref 0–40)
Albumin: 4.2 g/dL (ref 3.9–4.9)
Alkaline Phosphatase: 68 IU/L (ref 49–135)
BUN/Creatinine Ratio: 20 (ref 12–28)
BUN: 18 mg/dL (ref 8–27)
Bilirubin Total: 0.5 mg/dL (ref 0.0–1.2)
CO2: 24 mmol/L (ref 20–29)
Calcium: 9.3 mg/dL (ref 8.7–10.3)
Chloride: 104 mmol/L (ref 96–106)
Creatinine, Ser: 0.9 mg/dL (ref 0.57–1.00)
Globulin, Total: 2.3 g/dL (ref 1.5–4.5)
Glucose: 91 mg/dL (ref 70–99)
Potassium: 4.8 mmol/L (ref 3.5–5.2)
Sodium: 141 mmol/L (ref 134–144)
Total Protein: 6.5 g/dL (ref 6.0–8.5)
eGFR: 70 mL/min/1.73 (ref >59)

## 2023-10-18 LAB — LIPID PANEL
Cholesterol, Total: 167 mg/dL (ref 100–199)
HDL: 67 mg/dL (ref >39)
LDL CALC COMMENT:: 2.5 ratio (ref 0.0–4.4)
LDL Chol Calc (NIH): 86 mg/dL (ref 0–99)
Triglycerides: 71 mg/dL (ref 0–149)
VLDL Cholesterol Cal: 14 mg/dL (ref 5–40)

## 2023-10-18 LAB — THYROID PANEL WITH TSH
Free Thyroxine Index: 2.8 (ref 1.2–4.9)
T3 Uptake Ratio: 33 % (ref 24–39)
T4, Total: 8.6 ug/dL (ref 4.5–12.0)
TSH: 0.721 u[IU]/mL (ref 0.450–4.500)

## 2023-10-28 DIAGNOSIS — K080 Exfoliation of teeth due to systemic causes: Secondary | ICD-10-CM | POA: Diagnosis not present

## 2023-11-26 ENCOUNTER — Other Ambulatory Visit: Payer: Self-pay | Admitting: *Deleted

## 2023-11-26 DIAGNOSIS — E039 Hypothyroidism, unspecified: Secondary | ICD-10-CM

## 2023-11-26 DIAGNOSIS — I10 Essential (primary) hypertension: Secondary | ICD-10-CM

## 2023-12-04 DIAGNOSIS — Z1231 Encounter for screening mammogram for malignant neoplasm of breast: Secondary | ICD-10-CM | POA: Diagnosis not present

## 2023-12-04 LAB — HM MAMMOGRAPHY

## 2023-12-05 ENCOUNTER — Encounter: Payer: Self-pay | Admitting: Nurse Practitioner

## 2023-12-17 ENCOUNTER — Ambulatory Visit

## 2023-12-17 DIAGNOSIS — Z23 Encounter for immunization: Secondary | ICD-10-CM

## 2023-12-23 DIAGNOSIS — E785 Hyperlipidemia, unspecified: Secondary | ICD-10-CM

## 2023-12-23 MED ORDER — ROSUVASTATIN CALCIUM 10 MG PO TABS: 10.0000 mg | ORAL_TABLET | Freq: Every day | ORAL | 0 refills | Status: AC

## 2024-02-27 ENCOUNTER — Other Ambulatory Visit: Payer: Self-pay | Admitting: Nurse Practitioner

## 2024-02-27 DIAGNOSIS — I10 Essential (primary) hypertension: Secondary | ICD-10-CM

## 2024-04-14 ENCOUNTER — Ambulatory Visit: Payer: Self-pay | Admitting: Nurse Practitioner
